# Patient Record
Sex: Female | Born: 1980 | Race: White | Hispanic: No | Marital: Single | Smoking: Current every day smoker
Health system: Southern US, Community
[De-identification: ages and names within clinical notes are randomized; demographics above are authoritative.]

## PROBLEM LIST (undated history)

## (undated) ENCOUNTER — Ambulatory Visit: Admission: EM | Payer: BLUE CROSS/BLUE SHIELD | Source: Home / Self Care

## (undated) DIAGNOSIS — F419 Anxiety disorder, unspecified: Secondary | ICD-10-CM

## (undated) DIAGNOSIS — F329 Major depressive disorder, single episode, unspecified: Secondary | ICD-10-CM

## (undated) DIAGNOSIS — F4310 Post-traumatic stress disorder, unspecified: Secondary | ICD-10-CM

## (undated) DIAGNOSIS — F32A Depression, unspecified: Secondary | ICD-10-CM

---
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
## 2001-08-22 ENCOUNTER — Encounter: Payer: Self-pay | Admitting: Emergency Medicine

## 2001-08-22 ENCOUNTER — Emergency Department (HOSPITAL_COMMUNITY): Admission: EM | Admit: 2001-08-22 | Discharge: 2001-08-22 | Payer: Self-pay | Admitting: Emergency Medicine

## 2001-11-19 ENCOUNTER — Other Ambulatory Visit: Admission: RE | Admit: 2001-11-19 | Discharge: 2001-11-19 | Payer: Self-pay | Admitting: Family Medicine

## 2002-06-03 ENCOUNTER — Encounter: Payer: Self-pay | Admitting: Emergency Medicine

## 2002-06-03 ENCOUNTER — Emergency Department (HOSPITAL_COMMUNITY): Admission: EM | Admit: 2002-06-03 | Discharge: 2002-06-03 | Payer: Self-pay | Admitting: Emergency Medicine

## 2009-03-31 ENCOUNTER — Emergency Department (HOSPITAL_COMMUNITY): Admission: EM | Admit: 2009-03-31 | Discharge: 2009-03-31 | Payer: Self-pay | Admitting: Emergency Medicine

## 2012-08-05 ENCOUNTER — Emergency Department (INDEPENDENT_AMBULATORY_CARE_PROVIDER_SITE_OTHER)
Admission: EM | Admit: 2012-08-05 | Discharge: 2012-08-05 | Disposition: A | Discharge status: Home / Self Care | Payer: Self-pay | Source: Home / Self Care | Attending: Emergency Medicine | Admitting: Emergency Medicine

## 2012-08-05 ENCOUNTER — Encounter (HOSPITAL_COMMUNITY): Payer: Self-pay | Admitting: *Deleted

## 2012-08-05 DIAGNOSIS — R1032 Left lower quadrant pain: Secondary | ICD-10-CM

## 2012-08-05 DIAGNOSIS — N390 Urinary tract infection, site not specified: Secondary | ICD-10-CM

## 2012-08-05 LAB — POCT URINALYSIS DIP (DEVICE)
Bilirubin Urine: NEGATIVE
Glucose, UA: NEGATIVE mg/dL
Ketones, ur: NEGATIVE mg/dL
Nitrite: NEGATIVE

## 2012-08-05 LAB — WET PREP, GENITAL
Clue Cells Wet Prep HPF POC: NONE SEEN
Yeast Wet Prep HPF POC: NONE SEEN

## 2012-08-05 MED ORDER — PHENAZOPYRIDINE HCL 200 MG PO TABS: 200.0000 mg | ORAL_TABLET | Freq: Three times a day (TID) | ORAL | Status: AC | PRN

## 2012-08-05 MED ORDER — CEPHALEXIN 500 MG PO CAPS: 500.0000 mg | ORAL_CAPSULE | Freq: Three times a day (TID) | ORAL | Status: AC

## 2012-08-05 MED ORDER — NAPROXEN 500 MG PO TABS: 500.0000 mg | ORAL_TABLET | Freq: Two times a day (BID) | ORAL | Status: AC
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
## 2012-08-05 NOTE — ED Notes (Signed)
Pt     Reports         Lower  abd  Pain     l  Side    Radiating      To  l  Side          The  Symptoms    Started  Over  The  Weekend                        She  Also  Reports  A  Vaginal  Discharge  As  Well       The  Pt  Ambulated  To  Room  With a  Steady  Fluid  Gait

## 2012-08-06 MED ORDER — METRONIDAZOLE 500 MG PO TABS: ORAL_TABLET | ORAL | Status: DC

## 2012-08-07 LAB — URINE CULTURE: Colony Count: 70000

## 2012-08-12 ENCOUNTER — Telehealth (HOSPITAL_COMMUNITY): Payer: Self-pay | Admitting: *Deleted

## 2012-08-12 NOTE — ED Notes (Signed)
GC/Chlamydia neg., Wet prep: few trich, mod. WBC's. 9/13   Lab shown to Dr. Lorenz Coaster and Flagyl was e-prescribed to [facility].  9/19  I called pt. and left a message to call. Jessica Marshall 08/12/2012

## 2012-08-12 NOTE — ED Notes (Signed)
Pt. called back.  Pt. verified x 2 and given results. Pt. told she needs Flagyl and where to pick it up.   Pt. instructed no alcohol while taking this medication, to notify your partner to be treated with Flagyl, no sex until you have finished your medication and your partner has been treated and to practice safe sex. You can get HIV testing at the [facility] STD clinic by appointment.  Pt. voiced understanding. Vassie Moselle 08/12/2012

## 2014-03-13 ENCOUNTER — Emergency Department (HOSPITAL_COMMUNITY)
Admission: EM | Admit: 2014-03-13 | Discharge: 2014-03-13 | Disposition: A | Discharge status: Home / Self Care | Payer: Self-pay | Attending: Emergency Medicine | Admitting: Emergency Medicine

## 2014-03-13 ENCOUNTER — Encounter (HOSPITAL_COMMUNITY): Payer: Self-pay | Admitting: Emergency Medicine

## 2014-03-13 ENCOUNTER — Emergency Department (HOSPITAL_COMMUNITY): Payer: Self-pay

## 2014-03-13 DIAGNOSIS — Y929 Unspecified place or not applicable: Secondary | ICD-10-CM | POA: Insufficient documentation

## 2014-03-13 DIAGNOSIS — J069 Acute upper respiratory infection, unspecified: Secondary | ICD-10-CM | POA: Insufficient documentation

## 2014-03-13 DIAGNOSIS — F329 Major depressive disorder, single episode, unspecified: Secondary | ICD-10-CM | POA: Insufficient documentation

## 2014-03-13 DIAGNOSIS — S2341XA Sprain of ribs, initial encounter: Secondary | ICD-10-CM | POA: Insufficient documentation

## 2014-03-13 DIAGNOSIS — X58XXXA Exposure to other specified factors, initial encounter: Secondary | ICD-10-CM | POA: Insufficient documentation

## 2014-03-13 DIAGNOSIS — S29011A Strain of muscle and tendon of front wall of thorax, initial encounter: Secondary | ICD-10-CM

## 2014-03-13 DIAGNOSIS — R5381 Other malaise: Secondary | ICD-10-CM | POA: Insufficient documentation

## 2014-03-13 DIAGNOSIS — F17200 Nicotine dependence, unspecified, uncomplicated: Secondary | ICD-10-CM | POA: Insufficient documentation

## 2014-03-13 DIAGNOSIS — F3289 Other specified depressive episodes: Secondary | ICD-10-CM | POA: Insufficient documentation

## 2014-03-13 DIAGNOSIS — B9789 Other viral agents as the cause of diseases classified elsewhere: Secondary | ICD-10-CM

## 2014-03-13 DIAGNOSIS — Z79899 Other long term (current) drug therapy: Secondary | ICD-10-CM | POA: Insufficient documentation

## 2014-03-13 DIAGNOSIS — R5383 Other fatigue: Secondary | ICD-10-CM

## 2014-03-13 DIAGNOSIS — Y939 Activity, unspecified: Secondary | ICD-10-CM | POA: Insufficient documentation

## 2014-03-13 DIAGNOSIS — R42 Dizziness and giddiness: Secondary | ICD-10-CM | POA: Insufficient documentation

## 2014-03-13 HISTORY — DX: Depression, unspecified: F32.A

## 2014-03-13 HISTORY — DX: Major depressive disorder, single episode, unspecified: F32.9

## 2014-03-13 LAB — D-DIMER, QUANTITATIVE (NOT AT ARMC)

## 2014-03-13 MED ORDER — BENZONATATE 100 MG PO CAPS: 100.0000 mg | ORAL_CAPSULE | Freq: Three times a day (TID) | ORAL | Status: DC

## 2014-03-13 MED ORDER — ONDANSETRON 8 MG PO TBDP
8.0000 mg | ORAL_TABLET | Freq: Once | ORAL | Status: AC
  Administered 2014-03-13: 8 mg via ORAL
  Filled 2014-03-13: qty 1

## 2014-03-13 MED ORDER — IBUPROFEN 800 MG PO TABS
800.0000 mg | ORAL_TABLET | Freq: Once | ORAL | Status: AC
  Administered 2014-03-13: 800 mg via ORAL
  Filled 2014-03-13: qty 1

## 2014-03-13 MED ORDER — AEROCHAMBER PLUS W/MASK MISC
1.0000 | Freq: Once | Status: AC
  Administered 2014-03-13: 1
  Filled 2014-03-13 (×2): qty 1

## 2014-03-13 MED ORDER — ALBUTEROL SULFATE (2.5 MG/3ML) 0.083% IN NEBU
5.0000 mg | INHALATION_SOLUTION | Freq: Once | RESPIRATORY_TRACT | Status: AC
  Administered 2014-03-13: 5 mg via RESPIRATORY_TRACT
  Filled 2014-03-13: qty 6

## 2014-03-13 MED ORDER — ALBUTEROL SULFATE HFA 108 (90 BASE) MCG/ACT IN AERS
2.0000 | INHALATION_SPRAY | Freq: Once | RESPIRATORY_TRACT | Status: AC
  Administered 2014-03-13: 2 via RESPIRATORY_TRACT
  Filled 2014-03-13: qty 6.7

## 2014-03-13 NOTE — ED Notes (Signed)
Per pt, c/o shortness of breath, dizziness, cough since last week.  Also having sore throat.  No fever noted.

## 2014-03-13 NOTE — Discharge Instructions (Signed)
1. Medications: albuterol, tessalon, ibuprofen 800mg  three times per day with food, mucinex, usual home medications 2. Treatment: rest, drink plenty of fluids,  3. Follow Up: [facility] for discussion of your diagnoses and further evaluation after today's visit; if you do not have a primary care doctor use the resource guide provided to find one;    Upper Respiratory Infection, Adult An upper respiratory infection (URI) is also known as the common cold. It is often caused by a type of germ (virus). Colds are easily spread (contagious). You can pass it to others by kissing, coughing, sneezing, or drinking out of the same glass. Usually, you get better in 1 or 2 weeks.  HOME CARE   Only take medicine as told by your doctor.  Use a warm mist humidifier or breathe in steam from a hot shower.  Drink enough water and fluids to keep your pee (urine) clear or pale yellow.  Get plenty of rest.  Return to work when your temperature is back to normal or as told by your doctor. You may use a face mask and wash your hands to stop your cold from spreading. GET HELP RIGHT AWAY IF:   After the first few days, you feel you are getting worse.  You have questions about your medicine.  You have chills, shortness of breath, or brown or red spit (mucus).  You have yellow or brown snot (nasal discharge) or pain in the face, especially when you bend forward.  You have a fever, puffy (swollen) neck, pain when you swallow, or white spots in the back of your throat.  You have a bad headache, ear pain, sinus pain, or chest pain.  You have a high-pitched whistling sound when you breathe in and out (wheezing).  You have a lasting cough or cough up blood.  You have sore muscles or a stiff neck. MAKE SURE YOU:   Understand these instructions.  Will watch your condition.  Will get help right away if you are not doing well or get worse. Document Released:  04/28/2008 Document Revised: 02/02/2012 Document Reviewed: 03/17/2011 [facility] Patient Information 2014 [place], [place].    Emergency Department Resource Guide 1) Find a Doctor and Pay Out of Pocket Although you won't have to find out who is covered by your insurance plan, it is a good idea to ask around and get recommendations. You will then need to call the office and see if the doctor you have chosen will accept you as a new patient and what types of options they offer for patients who are self-pay. Some doctors offer discounts or will set up payment plans for their patients who do not have insurance, but you will need to ask so you aren't surprised when you get to your appointment.  2) Contact Your Local Health Department Not all health departments have doctors that can see patients for sick visits, but many do, so it is worth a call to see if yours does. If you don't know where your local health department is, you can check in your phone book. The CDC also has a tool to help you locate your state's health department, and many state websites also have listings of all of their local health departments.  3) Find a Walk-in Clinic If your illness is not likely to be very severe or complicated, you may want to try a walk in clinic. These are popping up all over the country in pharmacies, drugstores,  and shopping centers. They're usually staffed by nurse practitioners or physician assistants that have been trained to treat common illnesses and complaints. They're usually fairly quick and inexpensive. However, if you have serious medical issues or chronic medical problems, these are probably not your best option.  No Primary Care Doctor: - Call Health Connect at  [phone] - they can help you locate a primary care doctor that  accepts your insurance, provides certain services, etc. - Physician Referral Service- [phone]  Chronic Pain Problems: Organization         Address  Phone    Notes  [facility]  [phone] Patients need to be referred by their primary care doctor.   Medication Assistance: Organization         Address  Phone   Notes  [facility] Medication [facility] [address] [place], [place] [address] [phone] --Must be a resident of [place] [facility] -- Must have NO insurance coverage whatsoever (no Medicaid/ Medicare, etc.) -- The pt. MUST have a primary care doctor that directs their care regularly and follows them in the community   MedAssist  [phone]   [employer]  [phone]    Agencies that provide inexpensive medical care: Organization         Address  Phone   Notes  [facility]  [phone]   [facility]    [phone]) [phone]   [facility] [address] [place], [place] [address] [phone]   [facility] [address], [place] [phone]   [facility]anned Parenthood    [phone]) [phone]   [facility]    [phone]   [facility]  [address], [place] Phone:  [phone](336) [phone], Fax:  [phone](336) [phone] Hours of Operation:  9 am - 6 pm, M-F.  Also accepts Medicaid/Medicare and self-pay.  [facility]  [address], [place] Phone: [phone]) [phone], Fax: [phone]. Hours of Operation:  8:30 am - 5:30 pm, M-F.  Also accepts Medicaid and self-pay.  [facility] [place] [address], [place] Phone: [phone]   [facility] [address], [place], [place] [phone], Ext. [phone] Mondays & Thursdays: 7-9 AM.  First 15 patients are seen on a first come, first serve basis.    Medicaid-accepting [facility] Providers:  Organization         Address  Phone   Notes  [facility] [address], [place] [phone]) [phone] Also accepts self-pay patients.  [facility]  [address] [place], [place]  [phone]   [facility] [address], [place] [phone]   [facility] [address] [place] [address], [place] [phone]) [phone]   Renaye RakersVeita Bland [address], [place]   [phone]) [phone] Only accepts [place] Access [place] patients after they have their name applied to their card.   Self-Pay (no insurance) in [facility] [place]:  Organization         Address  Phone   Notes  Sickle Cell Patients, [facility] [address], [place] [phone]) [phone]   [facility] [address], [place] [phone]) [phone]   [facility] [place]  [address], [place] [phone])  [phone]   [facility]/Dr. Julio Sickssei-Bonsu  [address] [place] [address], [place] or [address], [place] [phone]) [phone] Phone number for both [place] and [place] locations is the same.  [facility] [address], [place] [phone]) [phone]   [facility] [address] [place] [address], [place] or [address] [phone]   [facility] [address], [place] [phone]) [phone], phone; [phone](336) [phone], fax Sees patients 1st and 3rd Saturday of every month.  Must not qualify for public or private insurance (i.e. Medicaid, Medicare, [place] Health Choice, Veterans' Benefits)  Household income should be no more than 200% of the poverty level The clinic cannot treat you if you are pregnant or think you are pregnant  Sexually transmitted diseases are not treated at the clinic.    Dental Care: Organization         Address  Phone  Notes  [facility] [address], [place] [phone] Accepts children up to age 32 who are enrolled in [place]icaid or [place] Health Choice; pregnant women with a Medicaid card; and children who have  applied for Medicaid or [place] Health Choice, but were declined, whose parents can pay a reduced fee at time of service.  [facility] [place]  [address], [place] [phone]) [phone] Accepts children up to age 32 who are enrolled in [place]icaid or [place] Health Choice; pregnant women with a Medicaid card; and children who have applied for Medicaid or [place] Health Choice, but were declined, whose parents can pay a reduced fee at time of service.  [facility]  [address], [place] [phone] Patients are seen by appointment only. Walk-ins are not accepted. [facility] will see patients 32 years of age and older. Monday - Tuesday (8am-5pm) Most Wednesdays (8:30-5pm) $30 per visit, cash only  [facility]  [address], [facility] [place] [phone] Patients are seen by appointment only. Walk-ins are not accepted. [facility] will see patients 32 years of age and older. One Wednesday Evening (Monthly: Volunteer Based).  $30 per visit, cash only  [facility]  [phone] for adults; Children under age 534, call Graduate Pediatric Dentistry at [phone]. Children aged 284-14, please call [phone] to request a pediatric application.  Dental services are provided in all areas of dental care including fillings, crowns and bridges, complete and partial dentures, implants, gum treatment, root canals, and extractions. Preventive care is also provided. Treatment is provided to both adults and children. Patients are selected via a lottery and there is often a waiting list.   [facility] [address], [place]  [phone] [email or web address]   [facility] [address], [place], [place] [phone], Ext. [phone] Second and Fourth Thursday of each month, opens at 6:30 AM; Clinic ends at 9 AM.  Patients are seen on a first-come first-served basis, and a  limited number are seen during each clinic.   [facility]  [address], [place], [place] [phone]) [phone]   Eligibility Requirements You must have lived in [place], [place], or [place] counties for at least the last three months.   You cannot be eligible for state or federal sponsored National Cityhealthcare insurance, including CIGNAVeterans Administration, [place]icaid, or [employer]Medicare.   You generally cannot be eligible  through your employer.  °  How to apply: °Eligibility screenings are held every Tuesday and Wednesday afternoon from 1:00 pm until 4:00 pm. You do not need an appointment for the interview!  °[facility] [address], [place], [place] [phone]   °[facility]  336-342-8273   °[place] [facility]  336-703-3100   °[place]  336-570-6415   ° °Behavioral Health Resources in the Community: °Intensive Outpatient Programs °Organization         Address  Phone  Notes  °[place] [facility] [address], [place], [place] [phone]   °[facility] [address], [place], [place] 336-832-9800   °[facility] [address], [place], [place] ° 336-882-2125   °[facility] [address],  °[place], [place] 1-800-853-5163 or 336-641-4981   °Substance Abuse Resources °Organization         Address  Phone  Notes  °[facility]  336-882-2125   °[facility]  336-784-9470   °[facility]  336-285-9073   °[facility]  336-845-3988   °Residential & Outpatient Substance Abuse Program  1-800-659-3381   °Psychological Services °Organization         Address  Phone  Notes  °[facility]  336- 832-9600   °[facility]  336- 378-7881   °[facility] [address], [place] 1-800-853-5163 or 336-641-4981   ° °Mobile Crisis Teams °Organization          Address  Phone  Notes  °[facility], Mobile Crisis Care Unit  1-877-626-1772   °Assertive °[facility]otherapeutic Services ° [address] [place], [place] 336-834-9664   °Sharon DeEsch [address] °[place] 336-554-5454   ° °Self-Help/Support Groups °Organization         Address  Phone             Notes  °[facility] [place] - variety of support groups  336- 373-1402 Call for more information  °Narcotics Anonymous (NA), [facility] [address], °[place]  2 meetings at this location  ° °Residential Treatment Programs °Organization         Address  Phone  Notes  °[facility] [address],    °[place]  1-866-801-8205   °[facility] ° [address], [place], [place] 704-293-8524   °[facility] [address], [place] 336-845-3988 Admissions: 8am-3pm M-F  °[facility] [address]    °[place], [place] 336-841-1104   °[facility] [address], [place], [place] 336-379-7146   °[facility] [address]  °[place], [place] 336-285-9073   °[facility] - Intensive Outpatient [address], , [place] 336-852-3033   °[facility] ([facility] [address]  °[place], [place] 1-877-615-2722 or 336-784-9470   °[facility] ([facility]) [address] [place], [place] 336-227-7417 Accepts Medicaid  °[facility] [address]  ° [place] 1-800-659-3381 Substance Abuse/Addiction Treatment  ° °[place] Behavioral Health Resources °Organization         Address  Phone  Notes  °[facility]  (888) 581-9988   °Julie Brannon, PhD [address] [place], [place]   (336) 349-5553 or (336) 951-0000   °[facility]   [address] °[place], [place] (336) 349-4454   °  (570) 718-0069   [facility] [address] [place], [place] [phone] Insurance/Medicaid/sponsorship  through [employer] and Families [address] [place] [address]                                    [place], [place] [phone] Therapy/tele-psych/case  [facility] [address]   [place], [place] [phone]    Dr. Lolly Mustache  [phone]   [facility] [place]  [employer] [facility] Dept. 1) [address] [place] 2) [address] [place] 3)  [address], [place] [phone]  [phone]   [facility] Child Abuse Hotline 581-672-9628 or (217) 114-0851 (After Hours)

## 2014-03-13 NOTE — ED Provider Notes (Signed)
CSN: [id number]     Arrival date & time 03/13/14  1513 History   First MD Initiated Contact with Patient 03/13/14 1625     Chief Complaint  Patient presents with  . Shortness of Breath  . Dizziness     (Consider location/radiation/quality/duration/timing/severity/associated sxs/prior Treatment) The history is provided by the patient and medical records. No language interpreter was used.    Jessica Marshall is a 33 y.o. female with a hx of depression presents to the Emergency Department complaining of gradual, waxing and waning, progressively worsening chest pain with inspiration, mild SOB and cough onset approx 1 week ago with worsening today.  Pt reports nasal congestion, post nasal drip, sore throat, cough and lightheadedness.  Pt reports attempting to smoke today and it made all of her symptoms worse.  Pt denies recent surgery, swelling in her legs, travel, immobilization and exogenous estrogen.   Pt has not attempted any OTC treatments.  Pt reports she just finished seeing mental health this AM when she tried to smoke and everything got worse. Nothing makes the symptoms better.  Pt denies personal cardiac hx but reports family hx of cardiac disease and blood clots.  No sudden death in the family.  Pt denies fever, chills, headache, neck pain, abd pain, N/V/D, weakness, dizziness, syncope, dysuria, hematuria.  Pt is an everyday smoker.    Past Medical History  Diagnosis Date  . Depression    History reviewed. No pertinent past surgical history. History reviewed. No pertinent family history. History  Substance Use Topics  . Smoking status: Current Every Day Smoker  . Smokeless tobacco: Not on file  . Alcohol Use: No   OB History   Grav Para Term Preterm Abortions TAB SAB Ect Mult Living                 Review of Systems  Constitutional: Positive for fatigue. Negative for fever, diaphoresis, appetite change and unexpected weight change.  HENT: Positive for congestion, postnasal  drip, rhinorrhea, sinus pressure and sore throat. Negative for mouth sores.   Eyes: Negative for visual disturbance.  Respiratory: Positive for cough and shortness of breath. Negative for chest tightness and wheezing.   Cardiovascular: Positive for chest pain.  Gastrointestinal: Negative for nausea, vomiting, abdominal pain, diarrhea and constipation.  Endocrine: Negative for polydipsia, polyphagia and polyuria.  Genitourinary: Negative for dysuria, urgency, frequency and hematuria.  Musculoskeletal: Negative for back pain and neck stiffness.  Skin: Negative for rash.  Allergic/Immunologic: Negative for immunocompromised state.  Neurological: Positive for light-headedness. Negative for syncope and headaches.  Hematological: Does not bruise/bleed easily.  Psychiatric/Behavioral: Negative for sleep disturbance. The patient is not nervous/anxious.       Allergies  Review of patient's allergies indicates no known allergies.  Home Medications   Prior to Admission medications   Medication Sig Start Date End Date Taking? Authorizing Provider  sertraline (ZOLOFT) 100 MG tablet Take 200 mg by mouth daily.    Yes Historical Provider, MD  traZODone (DESYREL) 100 MG tablet Take 100 mg by mouth at bedtime.   Yes Historical Provider, MD   BP 125/76  Pulse 60  Temp(Src) 98.2 F (36.8 C) (Oral)  Resp 20  Ht 5\' 8"  (1.727 m)  Wt 180 lb (81.647 kg)  BMI 27.38 kg/m2  SpO2 100%  LMP 02/23/2014 Physical Exam  Nursing note and vitals reviewed. Constitutional: She is oriented to person, place, and time. She appears well-developed and well-nourished. No distress.  Awake, alert, nontoxic appearance  HENT:  Head: Normocephalic and atraumatic.  Right Ear: Tympanic membrane, external ear and ear canal normal.  Left Ear: Tympanic membrane, external ear and ear canal normal.  Nose: Mucosal edema and rhinorrhea present. No epistaxis. Right sinus exhibits no maxillary sinus tenderness and no frontal  sinus tenderness. Left sinus exhibits no maxillary sinus tenderness and no frontal sinus tenderness.  Mouth/Throat: Uvula is midline, oropharynx is clear and moist and mucous membranes are normal. Mucous membranes are not pale and not cyanotic. No oropharyngeal exudate, posterior oropharyngeal edema, posterior oropharyngeal erythema or tonsillar abscesses.  Eyes: Conjunctivae are normal. Pupils are equal, round, and reactive to light. No scleral icterus.  Neck: Normal range of motion and full passive range of motion without pain. Neck supple.  Cardiovascular: Normal rate, regular rhythm, normal heart sounds and intact distal pulses.   No murmur heard. RRR - no tachycardia  Pulmonary/Chest: Effort normal. No accessory muscle usage or stridor. Not tachypneic. No respiratory distress. She has decreased breath sounds. She has no wheezes. She has no rhonchi. She has no rales. She exhibits tenderness.    Coarse, but otherwise clear and equal breath sounds; mildly diminished Pain to palpation of the anterior chest, right chest and right ribs  Abdominal: Soft. Bowel sounds are normal. She exhibits no mass. There is no tenderness. There is no rebound and no guarding.  abd soft and nontender  Musculoskeletal: Normal range of motion. She exhibits no edema.  ROM of right shoulder makes the right chest hurt worse Full ROM of right shoudler NO peripheral edema, no calf swelling no ttp of the bilateral calf, no palpable cord, neg Homan's sign  Lymphadenopathy:    She has no cervical adenopathy.  Neurological: She is alert and oriented to person, place, and time. She exhibits normal muscle tone. Coordination normal.  Speech is clear and goal oriented Moves extremities without ataxia  Skin: Skin is warm and dry. No rash noted. She is not diaphoretic. No erythema.  Psychiatric: She has a normal mood and affect.    ED Course  Procedures (including critical care time) Labs Review Labs Reviewed  D-DIMER,  QUANTITATIVE    Imaging Review Dg Chest 2 View  03/13/2014   CLINICAL DATA:  Shortness of breath with dizziness.  Smoker.  EXAM: CHEST  2 VIEW  COMPARISON:  DG CHEST 2V dated 11/06/2012  FINDINGS: The heart size and mediastinal contours are normal. The lungs are clear. There is no pleural effusion or pneumothorax. No acute osseous findings are identified.  IMPRESSION: Stable examination.  No acute cardiopulmonary process.   Electronically Signed   By: Roxy HorsemanBill  Veazey M.D.   On: 03/13/2014 16:45     EKG Interpretation None      ECG:  Date: 03/13/2014  Rate: 56  Rhythm: sinus bradycardia  QRS Axis: normal  Intervals: normal  ST/T Wave abnormalities: normal  Conduction Disutrbances:none  Narrative Interpretation: nonischemic ECG, no old for comparison  Old EKG Reviewed: none available    MDM   Final diagnoses:  Viral URI with cough  Chest wall muscle strain   Jessica Marshall presents with URI symptoms including rhinorrhea, sore throat, cough, postnasal drip and lightheadedness. Patient has associated right-sided chest pain worse with inspiration, movement and palpation.  Pain is reproducible on exam with palpation to the right side of the chest and right ribs. Patient is alert, oriented, nontoxic nonseptic appearing.  Clinical breath sounds without increased effort. No tachycardia.  Family and patient report SIGNIFICANT concern about cardiac or clot etiology  of pt's pain.  Will give albuterol treatment, obtain ECG and d-dimer.   Patient is PERC negative; no clinical evidence of DVT.  7:06 PM Patient reports she is breathing much better after her albuterol treatment. Breath sounds remain clear and equal with slightly increased tidal volume. D-dimer negative. Chest x-ray without evidence of pneumonia, pulmonary edema or pneumothorax.  ECG nonischemic.  I personally reviewed the imaging tests through PACS system  I reviewed available ER/hospitalization records through the  EMR  Patient's chest pain continues to be reproducible and she reports alleviation of her shortness of breath and dizziness after her albuterol treatment.  Patient without hypoxia throughout her time here in the emergency department.  Highly doubt PE. Highly doubt cardiac etiology of patient's symptoms.  She ambulates without hypoxia or shortness of breath.  Chest pain likely secondary to chest wall strain from coughing.  Pt CXR negative for acute infiltrate. Patients symptoms are consistent with URI, likely viral etiology. Discussed that antibiotics are not indicated for viral infections. Pt will be discharged with symptomatic treatment including albuterol inhaler and cough suppression.  Verbalizes understanding and is agreeable with plan. Pt is hemodynamically stable & in NAD prior to dc.  It has been determined that no acute conditions requiring further emergency intervention are present at this time. The patient/guardian have been advised of the diagnosis and plan. We have discussed signs and symptoms that warrant return to the ED, such as changes or worsening in symptoms.   Vital signs are stable at discharge.   BP 125/76  Pulse 60  Temp(Src) 98.2 F (36.8 C) (Oral)  Resp 20  Ht 5\' 8"  (1.727 m)  Wt 180 lb (81.647 kg)  BMI 27.38 kg/m2  SpO2 100%  LMP 02/23/2014  Patient/guardian has voiced understanding and agreed to follow-up with the PCP or specialist.          Dierdre Forth, PA-C 03/13/14 1918

## 2014-03-14 NOTE — ED Provider Notes (Signed)
Medical screening examination/treatment/procedure(s) were performed by non-physician practitioner and as supervising physician I was immediately available for consultation/collaboration.   EKG Interpretation None        Jessica ChurnJohn David Saliha Salts III, MD 03/14/14 [phone]

## 2015-06-20 ENCOUNTER — Encounter (HOSPITAL_COMMUNITY): Payer: Self-pay | Admitting: Emergency Medicine

## 2015-06-20 ENCOUNTER — Emergency Department (INDEPENDENT_AMBULATORY_CARE_PROVIDER_SITE_OTHER)
Admission: EM | Admit: 2015-06-20 | Discharge: 2015-06-20 | Disposition: A | Discharge status: Home / Self Care | Payer: Self-pay | Source: Home / Self Care | Attending: Family Medicine | Admitting: Family Medicine

## 2015-06-20 DIAGNOSIS — K0889 Other specified disorders of teeth and supporting structures: Secondary | ICD-10-CM

## 2015-06-20 DIAGNOSIS — K088 Other specified disorders of teeth and supporting structures: Secondary | ICD-10-CM

## 2015-06-20 MED ORDER — DICLOFENAC SODIUM 75 MG PO TBEC: 75.0000 mg | DELAYED_RELEASE_TABLET | Freq: Two times a day (BID) | ORAL | Status: DC

## 2015-06-20 MED ORDER — AMOXICILLIN 500 MG PO CAPS: 500.0000 mg | ORAL_CAPSULE | Freq: Three times a day (TID) | ORAL | Status: DC

## 2015-06-20 NOTE — Discharge Instructions (Signed)

## 2015-06-20 NOTE — ED Notes (Signed)
Requesting work note

## 2015-06-20 NOTE — ED Provider Notes (Signed)
CSN: [id number]     Arrival date & time 06/20/15  1304 History   First MD Initiated Contact with Patient 06/20/15 1337     No chief complaint on file.  (Consider location/radiation/quality/duration/timing/severity/associated sxs/prior Treatment) Patient is a 34 y.o. female presenting with tooth pain. The history is provided by the patient. No language interpreter was used.  Dental Pain Location:  Lower Quality:  Aching Severity:  Moderate Onset quality:  Gradual Duration:  2 days Timing:  Constant Progression:  Worsening Chronicity:  New Context: not dental fracture   Previous work-up:  Dental exam Relieved by:  Nothing Worsened by:  Nothing tried Associated symptoms: gum swelling   Associated symptoms: no congestion and no fever   Risk factors: no diabetes     Past Medical History  Diagnosis Date  . Depression    No past surgical history on file. No family history on file. History  Substance Use Topics  . Smoking status: Current Every Day Smoker  . Smokeless tobacco: Not on file  . Alcohol Use: No   OB History    No data available     Review of Systems  Constitutional: Negative for fever.  HENT: Negative for congestion.   All other systems reviewed and are negative.   Allergies  Review of patient's allergies indicates no known allergies.  Home Medications   Prior to Admission medications   Medication Sig Start Date End Date Taking? Authorizing Provider  benzonatate (TESSALON) 100 MG capsule Take 1 capsule (100 mg total) by mouth every 8 (eight) hours. 03/13/14   Hannah Muthersbaugh, PA-C  sertraline (ZOLOFT) 100 MG tablet Take 200 mg by mouth daily.     Historical Provider, MD  traZODone (DESYREL) 100 MG tablet Take 100 mg by mouth at bedtime.    Historical Provider, MD   There were no vitals taken for this visit. Physical Exam  Constitutional: She is oriented to person, place, and time. She appears well-developed and well-nourished.  HENT:  Head:  Normocephalic.  No obv abscess  Eyes: EOM are normal.  Neck: Normal range of motion.  Pulmonary/Chest: Effort normal.  Abdominal: She exhibits no distension.  Musculoskeletal: Normal range of motion.  Neurological: She is alert and oriented to person, place, and time.  Psychiatric: She has a normal mood and affect.  Nursing note and vitals reviewed.   ED Course  Procedures (including critical care time) Labs Review Labs Reviewed - No data to display  Imaging Review No results found.   MDM   1. Toothache    Pt has an appointment with dentist on Monday voltaren amoxicillian AVS    Elson Areas, PA-C 06/20/15 1432

## 2015-06-20 NOTE — ED Notes (Signed)
Patient reports both, bottom, back teeth hurting.  Reports having a dentist appt scheduled for next week

## 2015-10-04 ENCOUNTER — Encounter (HOSPITAL_COMMUNITY): Payer: Self-pay | Admitting: Emergency Medicine

## 2015-10-04 ENCOUNTER — Emergency Department (HOSPITAL_COMMUNITY)
Admission: EM | Admit: 2015-10-04 | Discharge: 2015-10-04 | Disposition: A | Discharge status: Home / Self Care | Payer: Self-pay | Attending: Emergency Medicine | Admitting: Emergency Medicine

## 2015-10-04 DIAGNOSIS — Z720 Tobacco use: Secondary | ICD-10-CM | POA: Insufficient documentation

## 2015-10-04 DIAGNOSIS — Z5189 Encounter for other specified aftercare: Secondary | ICD-10-CM

## 2015-10-04 DIAGNOSIS — F329 Major depressive disorder, single episode, unspecified: Secondary | ICD-10-CM | POA: Insufficient documentation

## 2015-10-04 DIAGNOSIS — Z4801 Encounter for change or removal of surgical wound dressing: Secondary | ICD-10-CM | POA: Insufficient documentation

## 2015-10-04 DIAGNOSIS — M7981 Nontraumatic hematoma of soft tissue: Secondary | ICD-10-CM | POA: Insufficient documentation

## 2015-10-04 DIAGNOSIS — Z792 Long term (current) use of antibiotics: Secondary | ICD-10-CM | POA: Insufficient documentation

## 2015-10-04 DIAGNOSIS — Z791 Long term (current) use of non-steroidal anti-inflammatories (NSAID): Secondary | ICD-10-CM | POA: Insufficient documentation

## 2015-10-04 DIAGNOSIS — Z79899 Other long term (current) drug therapy: Secondary | ICD-10-CM | POA: Insufficient documentation

## 2015-10-04 MED ORDER — NAPROXEN 500 MG PO TABS: 500.0000 mg | ORAL_TABLET | Freq: Two times a day (BID) | ORAL | Status: DC

## 2015-10-04 NOTE — Discharge Instructions (Signed)
Abscess looks okay right now, will likely be sore for the next day or so which is normal. Continue to perform warm compresses and dressing changes at home. Take Naprosyn as directed. You may supplement this with Tylenol if needed. Follow-up with urgent care for recheck or return here for any new or worsening symptoms.

## 2015-10-04 NOTE — ED Notes (Signed)
Patient states went to urgent care yesterday and had abscess I & D'd, but states "it's filling back up and hurts a lot".  Patient states took advil at home and it didn't help at all.

## 2015-10-04 NOTE — ED Provider Notes (Signed)
CSN: [id number]     Arrival date & time 10/04/15  4098 History   First MD Initiated Contact with Patient 10/04/15 [phone]     Chief Complaint  Patient presents with  . Abscess     (Consider location/radiation/quality/duration/timing/severity/associated sxs/prior Treatment) Patient is a 34 y.o. female presenting with abscess. The history is provided by the patient and medical records.  Abscess    34 year old female with history of depression, presenting to the ED for left thigh abscess recheck. Patient states she had abscess I&D'd yesterday at urgent care, however states pain is worse today.  She states abscess has continued to drain. She denies any fever or chills. She states her leg feels more "swollen" today. Pain is worse with sitting on her right side or when she walks in her thighs rub together.  Patient states she was started on antibiotics and was given Vicodin, however she is very taken all the Vicodin. She took Aleve this morning without relief.  VSS.  Past Medical History  Diagnosis Date  . Depression    History reviewed. No pertinent past surgical history. No family history on file. Social History  Substance Use Topics  . Smoking status: Current Every Day Smoker -- 0.50 packs/day    Types: Cigarettes  . Smokeless tobacco: None  . Alcohol Use: No   OB History    No data available     Review of Systems  Skin: Positive for wound.  All other systems reviewed and are negative.     Allergies  Review of patient's allergies indicates no known allergies.  Home Medications   Prior to Admission medications   Medication Sig Start Date End Date Taking? Authorizing Provider  amoxicillin (AMOXIL) 500 MG capsule Take 1 capsule (500 mg total) by mouth 3 (three) times daily. 06/20/15   Elson Areas, PA-C  benzonatate (TESSALON) 100 MG capsule Take 1 capsule (100 mg total) by mouth every 8 (eight) hours. 03/13/14   Hannah Muthersbaugh, PA-C  diclofenac (VOLTAREN) 75 MG EC  tablet Take 1 tablet (75 mg total) by mouth 2 (two) times daily. 06/20/15   Elson Areas, PA-C  sertraline (ZOLOFT) 100 MG tablet Take 200 mg by mouth daily.     Historical Provider, MD  traZODone (DESYREL) 100 MG tablet Take 100 mg by mouth at bedtime.    Historical Provider, MD   BP 130/74 mmHg  Pulse 78  Temp(Src) 97.9 F (36.6 C) (Oral)  Resp 16  Wt 183 lb 3 oz (83.093 kg)  SpO2 100%  LMP 09/27/2015   Physical Exam  Constitutional: She is oriented to person, place, and time. She appears well-developed and well-nourished.  HENT:  Head: Normocephalic and atraumatic.  Mouth/Throat: Oropharynx is clear and moist.  Eyes: Conjunctivae and EOM are normal. Pupils are equal, round, and reactive to light.  Neck: Normal range of motion.  Cardiovascular: Normal rate, regular rhythm and normal heart sounds.   Pulmonary/Chest: Effort normal and breath sounds normal.  Abdominal: Soft. Bowel sounds are normal.  Musculoskeletal: Normal range of motion.  Neurological: She is alert and oriented to person, place, and time.  Skin: Skin is warm and dry.  Abscess noted to left medial thigh, open and draining purulent fluid; no central fluctuance; small amount of bruising noted; slight erythema surrounding edges of abscess without overt induration or cellulitis  Psychiatric: She has a normal mood and affect.  Nursing note and vitals reviewed.   ED Course  Procedures (including critical care time) Labs Review Labs  Reviewed - No data to display  Imaging Review No results found. I have personally reviewed and evaluated these images and lab results as part of my medical decision-making.   EKG Interpretation None      MDM   Final diagnoses:  Wound check, abscess   34 year old female here for recheck of left thigh abscess that was incised yesterday urgent care. Patient afebrile, nontoxic. Abscess is open and draining purulent fluid. There is no central fluctuance, surrounding erythema, or  signs of induration. There is no lymphangitis. There is a small amount of bruising noted which I suspect is from I&D. At this time, do not feel further intervention required. Patient will continue her antibiotics. She is to follow-up with urgent care for further rechecks.  Rx naprosyn.  Discussed plan with patient, he/she acknowledged understanding and agreed with plan of care.  Return precautions given for new or worsening symptoms.  Garlon HatchetLisa M Rossana Molchan, PA-C 10/04/15 [phone]  Marily MemosJason Mesner, MD 10/04/15 [phone]

## 2017-03-26 DIAGNOSIS — R197 Diarrhea, unspecified: Secondary | ICD-10-CM | POA: Diagnosis not present

## 2017-03-26 DIAGNOSIS — R1030 Lower abdominal pain, unspecified: Secondary | ICD-10-CM | POA: Diagnosis not present

## 2017-05-19 DIAGNOSIS — Z5181 Encounter for therapeutic drug level monitoring: Secondary | ICD-10-CM | POA: Diagnosis not present

## 2017-06-02 ENCOUNTER — Emergency Department (HOSPITAL_COMMUNITY)
Admission: EM | Admit: 2017-06-02 | Discharge: 2017-06-02 | Disposition: A | Discharge status: Home / Self Care | Payer: 59 | Attending: Emergency Medicine | Admitting: Emergency Medicine

## 2017-06-02 ENCOUNTER — Ambulatory Visit (HOSPITAL_COMMUNITY)
Admission: EM | Admit: 2017-06-02 | Discharge: 2017-06-02 | Disposition: A | Discharge status: Nursing Home (Includes ICF) | Payer: 59 | Source: Home / Self Care

## 2017-06-02 ENCOUNTER — Emergency Department (HOSPITAL_COMMUNITY): Payer: 59

## 2017-06-02 ENCOUNTER — Encounter (HOSPITAL_COMMUNITY): Payer: Self-pay | Admitting: Vascular Surgery

## 2017-06-02 DIAGNOSIS — Z79899 Other long term (current) drug therapy: Secondary | ICD-10-CM | POA: Insufficient documentation

## 2017-06-02 DIAGNOSIS — R0789 Other chest pain: Secondary | ICD-10-CM | POA: Diagnosis not present

## 2017-06-02 DIAGNOSIS — R079 Chest pain, unspecified: Secondary | ICD-10-CM | POA: Diagnosis not present

## 2017-06-02 DIAGNOSIS — F17210 Nicotine dependence, cigarettes, uncomplicated: Secondary | ICD-10-CM | POA: Insufficient documentation

## 2017-06-02 LAB — I-STAT TROPONIN, ED: TROPONIN I, POC: 0 ng/mL (ref 0.00–0.08)

## 2017-06-02 LAB — BASIC METABOLIC PANEL
ANION GAP: 7 (ref 5–15)
BUN: 9 mg/dL (ref 6–20)
CALCIUM: 9.5 mg/dL (ref 8.9–10.3)
CO2: 23 mmol/L (ref 22–32)
Chloride: 106 mmol/L (ref 101–111)
Creatinine, Ser: 1.02 mg/dL — ABNORMAL HIGH (ref 0.44–1.00)
Glucose, Bld: 87 mg/dL (ref 65–99)
Potassium: 4.5 mmol/L (ref 3.5–5.1)
Sodium: 136 mmol/L (ref 135–145)

## 2017-06-02 LAB — CBC
HCT: 39.1 % (ref 36.0–46.0)
Hemoglobin: 13.3 g/dL (ref 12.0–15.0)
MCH: 32.3 pg (ref 26.0–34.0)
MCHC: 34 g/dL (ref 30.0–36.0)
MCV: 94.9 fL (ref 78.0–100.0)
Platelets: 225 10*3/uL (ref 150–400)
RBC: 4.12 MIL/uL (ref 3.87–5.11)
RDW: 12 % (ref 11.5–15.5)
WBC: 6.4 10*3/uL (ref 4.0–10.5)

## 2017-06-02 MED ORDER — METHOCARBAMOL 750 MG PO TABS: 750.0000 mg | ORAL_TABLET | Freq: Four times a day (QID) | ORAL | 0 refills | Status: DC

## 2017-06-02 MED ORDER — KETOROLAC TROMETHAMINE 60 MG/2ML IM SOLN
60.0000 mg | Freq: Once | INTRAMUSCULAR | Status: AC
  Administered 2017-06-02: 60 mg via INTRAMUSCULAR
  Filled 2017-06-02: qty 2

## 2017-06-02 MED ORDER — METHOCARBAMOL 500 MG PO TABS
500.0000 mg | ORAL_TABLET | Freq: Once | ORAL | Status: AC
  Administered 2017-06-02: 500 mg via ORAL
  Filled 2017-06-02: qty 1

## 2017-06-02 MED ORDER — IBUPROFEN 600 MG PO TABS: 600.0000 mg | ORAL_TABLET | Freq: Four times a day (QID) | ORAL | 0 refills | Status: DC | PRN

## 2017-06-02 NOTE — ED Triage Notes (Signed)
Pt reports to the ED for eval of left sided, sharp CPs since last Wednesday. Pain is worse with movement and deep breaths. She reports some associated SOB, diaphoresis, nausea and back pain. She has not tried any medications for the pain.

## 2017-06-02 NOTE — ED Provider Notes (Signed)
[facility]-EMERGENCY DEPT Provider Note   CSN: [id number] Arrival date & time: 06/02/17  1121     History   Chief Complaint Chief Complaint  Patient presents with  . Chest Pain    HPI Jessica Marshall is a 36 y.o. female.  36 year old female presents with left-sided sharp chest pain that radiates to her left flank 1 week. Pain is worse with movement and better with remaining still. She denies any rashes or sensation of burning to the skin. No urinary symptoms. Some shortness of breath but denies any exertional dyspnea. No leg pain or swelling. Pain better with remaining still. She also notes increased anxiety due to the recent death of her friend. Has had insomnia but denies any suicidal or homicidal ideations. Denies any syncope or near-syncope. No palpitations. No prior history of same.      Past Medical History:  Diagnosis Date  . Depression     There are no active problems to display for this patient.   History reviewed. No pertinent surgical history.  OB History    No data available       Home Medications    Prior to Admission medications   Medication Sig Start Date End Date Taking? Authorizing Provider  amoxicillin (AMOXIL) 500 MG capsule Take 1 capsule (500 mg total) by mouth 3 (three) times daily. 06/20/15   Elson AreasSofia, Leslie K, PA-C  benzonatate (TESSALON) 100 MG capsule Take 1 capsule (100 mg total) by mouth every 8 (eight) hours. 03/13/14   Muthersbaugh, Dahlia ClientHannah, PA-C  diclofenac (VOLTAREN) 75 MG EC tablet Take 1 tablet (75 mg total) by mouth 2 (two) times daily. 06/20/15   Elson AreasSofia, Leslie K, PA-C  naproxen (NAPROSYN) 500 MG tablet Take 1 tablet (500 mg total) by mouth 2 (two) times daily with a meal. 10/04/15   Garlon HatchetSanders, Lisa M, PA-C  sertraline (ZOLOFT) 100 MG tablet Take 200 mg by mouth daily.     [provider]  traZODone (DESYREL) 100 MG tablet Take 100 mg by mouth at bedtime.    [provider]    Family History No family history on  file.  Social History Social History  Substance Use Topics  . Smoking status: Current Every Day Smoker    Packs/day: 0.50    Types: Cigarettes  . Smokeless tobacco: Never Used  . Alcohol use No     Allergies   Patient has no known allergies.   Review of Systems Review of Systems  All other systems reviewed and are negative.    Physical Exam Updated Vital Signs BP 107/62 (BP Location: Right Arm)   Pulse 64   Temp 97.8 F (36.6 C) (Oral)   Resp 16   Ht 1.702 m (5\' 7" )   Wt 78 kg (172 lb)   LMP 05/11/2017   SpO2 100%   BMI 26.94 kg/m   Physical Exam  Constitutional: She is oriented to person, place, and time. She appears well-developed and well-nourished.  Non-toxic appearance. No distress.  HENT:  Head: Normocephalic and atraumatic.  Eyes: Conjunctivae, EOM and lids are normal. Pupils are equal, round, and reactive to light.  Neck: Normal range of motion. Neck supple. No tracheal deviation present. No thyroid mass present.  Cardiovascular: Normal rate, regular rhythm and normal heart sounds.  Exam reveals no gallop.   No murmur heard. Pulmonary/Chest: Effort normal and breath sounds normal. No stridor. No respiratory distress. She has no decreased breath sounds. She has no wheezes. She has no rhonchi. She has no rales.  Abdominal: Soft. Normal appearance and bowel sounds are normal. She exhibits no distension. There is no tenderness. There is no rebound and no CVA tenderness.  Musculoskeletal: Normal range of motion. She exhibits no edema or tenderness.       Back:  Neurological: She is alert and oriented to person, place, and time. She has normal strength. No cranial nerve deficit or sensory deficit. GCS eye subscore is 4. GCS verbal subscore is 5. GCS motor subscore is 6.  Skin: Skin is warm and dry. No abrasion and no rash noted.  Psychiatric: She has a normal mood and affect. Her speech is normal and behavior is normal.  Nursing note and vitals  reviewed.    ED Treatments / Results  Labs (all labs ordered are listed, but only abnormal results are displayed) Labs Reviewed  BASIC METABOLIC PANEL - Abnormal; Notable for the following:       Result Value   Creatinine, Ser 1.02 (*)    All other components within normal limits  CBC  I-STAT TROPOININ, ED    EKG  EKG Interpretation None       Radiology Dg Chest 2 View  Result Date: 06/02/2017 CLINICAL DATA:  Left-sided chest pain radiating to back since last Wednesday. EXAM: CHEST  2 VIEW COMPARISON:  Chest x-ray dated 03/24/2016. FINDINGS: Cardiomediastinal silhouette is normal in size and configuration. Lungs are clear. Lung volumes are normal. No evidence of pneumonia. No pleural effusion. No pneumothorax. Osseous and soft tissue structures about the chest are unremarkable. IMPRESSION: Normal chest x-ray. Electronically Signed   By: Bary Richard M.D.   On: 06/02/2017 11:56    Procedures Procedures (including critical care time)  Medications Ordered in ED Medications  methocarbamol (ROBAXIN) tablet 500 mg (not administered)  ketorolac (TORADOL) injection 60 mg (60 mg Intramuscular Given 06/02/17 1245)     Initial Impression / Assessment and Plan / ED Course  I have reviewed the triage vital signs and the nursing notes.  Pertinent labs & imaging results that were available during my care of the patient were reviewed by me and considered in my medical decision making (see chart for details).     ED ECG REPORT   Date: 06/02/2017  Rate: 78  Rhythm: normal sinus rhythm  QRS Axis: normal  Intervals: normal  ST/T Wave abnormalities: normal  Conduction Disutrbances:none  Narrative Interpretation:   Old EKG Reviewed: none available  I have personally reviewed the EKG tracing and agree with the computerized printout as noted.   Final Clinical Impressions(s) / ED Diagnoses   Final diagnoses:  None   Patient given Toradol and Robaxin and feels better. Suspect  musculoskeletal pain. We'll discharge home with return precautions New Prescriptions New Prescriptions   No medications on file     Lorre Nick, MD 06/02/17 1414

## 2017-06-12 ENCOUNTER — Encounter (HOSPITAL_COMMUNITY): Payer: Self-pay | Admitting: Emergency Medicine

## 2017-06-12 ENCOUNTER — Other Ambulatory Visit: Payer: Self-pay

## 2017-06-12 ENCOUNTER — Emergency Department (HOSPITAL_COMMUNITY)
Admission: EM | Admit: 2017-06-12 | Discharge: 2017-06-12 | Disposition: A | Discharge status: Home / Self Care | Payer: 59 | Attending: Emergency Medicine | Admitting: Emergency Medicine

## 2017-06-12 ENCOUNTER — Emergency Department (HOSPITAL_COMMUNITY): Payer: 59

## 2017-06-12 DIAGNOSIS — R0789 Other chest pain: Secondary | ICD-10-CM | POA: Insufficient documentation

## 2017-06-12 DIAGNOSIS — R0602 Shortness of breath: Secondary | ICD-10-CM | POA: Diagnosis not present

## 2017-06-12 DIAGNOSIS — Z79899 Other long term (current) drug therapy: Secondary | ICD-10-CM | POA: Diagnosis not present

## 2017-06-12 DIAGNOSIS — F17210 Nicotine dependence, cigarettes, uncomplicated: Secondary | ICD-10-CM | POA: Diagnosis not present

## 2017-06-12 DIAGNOSIS — R079 Chest pain, unspecified: Secondary | ICD-10-CM | POA: Diagnosis not present

## 2017-06-12 HISTORY — DX: Anxiety disorder, unspecified: F41.9

## 2017-06-12 HISTORY — DX: Post-traumatic stress disorder, unspecified: F43.10

## 2017-06-12 LAB — CBC
HCT: 39.5 % (ref 36.0–46.0)
HEMOGLOBIN: 13.9 g/dL (ref 12.0–15.0)
MCH: 33.2 pg (ref 26.0–34.0)
MCHC: 35.2 g/dL (ref 30.0–36.0)
MCV: 94.3 fL (ref 78.0–100.0)
Platelets: 217 10*3/uL (ref 150–400)
RBC: 4.19 MIL/uL (ref 3.87–5.11)
RDW: 12 % (ref 11.5–15.5)
WBC: 5.7 10*3/uL (ref 4.0–10.5)

## 2017-06-12 LAB — BASIC METABOLIC PANEL
ANION GAP: 8 (ref 5–15)
BUN: 7 mg/dL (ref 6–20)
CALCIUM: 9.7 mg/dL (ref 8.9–10.3)
CO2: 26 mmol/L (ref 22–32)
Chloride: 102 mmol/L (ref 101–111)
Creatinine, Ser: 0.89 mg/dL (ref 0.44–1.00)
GFR calc Af Amer: >60 mL/min (ref >60)
GLUCOSE: 92 mg/dL (ref 65–99)
Potassium: 4.3 mmol/L (ref 3.5–5.1)
Sodium: 136 mmol/L (ref 135–145)

## 2017-06-12 LAB — POCT I-STAT TROPONIN I: TROPONIN I, POC: 0 ng/mL (ref 0.00–0.08)

## 2017-06-12 MED ORDER — HYDROXYZINE HCL 25 MG PO TABS
25.0000 mg | ORAL_TABLET | Freq: Once | ORAL | Status: AC
  Administered 2017-06-12: 25 mg via ORAL
  Filled 2017-06-12: qty 1

## 2017-06-12 NOTE — ED Notes (Signed)
Patient reports numbness around mouth and neck area that started after taking Robaxin. States she was prescribed robaxin on last week due to some chest wall pain and back pain. C/O back pain, rib pain, right chest pain. Neuro intact.

## 2017-06-12 NOTE — ED Notes (Signed)
Pt had drawn in triage: Blue Gold Lavender Lt green  Dark green x2 

## 2017-06-12 NOTE — ED Triage Notes (Signed)
Pt states that she has had chest pain x 1 week and was dx with chest wall pain and given robaxin. States that she started having bilateral facial numbness and headache on Monday. Alert and oriented. Neuro intact.

## 2017-06-12 NOTE — ED Provider Notes (Signed)
[facility] Provider Note   CSN: [id number] Arrival date & time: 06/12/17  1044     History   Chief Complaint Chief Complaint  Patient presents with  . Chest Pain  . Numbness    HPI Jessica Marshall is a 36 y.o. female.  HPI   36 year old female with history of anxiety, depression, PTSD presenting complaining of chest pain. Patient has been reporting persistent anterior chest pressure with tingling and numbness sensation starting from the jaw for mild extending towards her neck and her chest which has been ongoing for the past week. Symptom has not gotten any better. She was seen in the ED a week ago for her complaint and was diagnosed with chest wall pain. She was given Robaxin for her symptoms. She was taking the medication when the symptoms appears. She discussed this with her primary care provider who told her to stop taking Robaxin. She has stopped taking the medication but the symptom has not resolved. She does admits to having increasing stress lately "I have a lot, mind". Denies SI or HI. She is a smoker. Denies any history of cardiac disease. No prior history of PE or DVT, no recent surgery, prolonged bed rest, taking oral hormone, having legs swelling or calf pain. Denies any strenuous activity or any recent injury.  Past Medical History:  Diagnosis Date  . Anxiety   . Depression   . PTSD (post-traumatic stress disorder)     There are no active problems to display for this patient.   No past surgical history on file.  OB History    No data available       Home Medications    Prior to Admission medications   Medication Sig Start Date End Date Taking? Authorizing Provider  ibuprofen (ADVIL,MOTRIN) 600 MG tablet Take 1 tablet (600 mg total) by mouth every 6 (six) hours as needed. 06/02/17   Lorre NickAllen, Anthony, MD  methocarbamol (ROBAXIN-750) 750 MG tablet Take 1 tablet (750 mg total) by mouth 4 (four) times daily. 06/02/17   Lorre NickAllen, Anthony, MD  sertraline  (ZOLOFT) 100 MG tablet Take 100 mg by mouth daily.     [provider]  traZODone (DESYREL) 100 MG tablet Take 100 mg by mouth at bedtime.    [provider]    Family History No family history on file.  Social History Social History  Substance Use Topics  . Smoking status: Current Every Day Smoker    Packs/day: 0.50    Types: Cigarettes  . Smokeless tobacco: Never Used  . Alcohol use No     Allergies   Patient has no known allergies.   Review of Systems Review of Systems  All other systems reviewed and are negative.    Physical Exam Updated Vital Signs BP 119/87 (BP Location: Right Arm)   Pulse 68   Temp 98 F (36.7 C) (Oral)   Resp 16   LMP 06/11/2017   SpO2 100%   Physical Exam  Constitutional: She is oriented to person, place, and time. She appears well-developed and well-nourished. No distress.  Patient with flat affect however in no acute discomfort.  HENT:  Head: Atraumatic.  Eyes: Conjunctivae are normal.  Neck: Neck supple.  Cardiovascular: Normal rate and intact distal pulses.   Pulmonary/Chest: Effort normal and breath sounds normal. No stridor. She exhibits tenderness (Tenderness to palpation of anterior chest wall without any overlying skin changes.).  Abdominal: Soft. She exhibits no distension. There is no tenderness.  Musculoskeletal: She exhibits  no edema.  Neurological: She is alert and oriented to person, place, and time.  Sensation is intact throughout, including periorbital region, anterior neck and anterior chest on gentle palpation.  Skin: No rash noted.  Psychiatric: She has a normal mood and affect.  Nursing note and vitals reviewed.    ED Treatments / Results  Labs (all labs ordered are listed, but only abnormal results are displayed) Labs Reviewed  BASIC METABOLIC PANEL  CBC  I-STAT TROPONIN, ED  POCT I-STAT TROPONIN I    EKG  EKG Interpretation None      Date: 06/12/2017  Rate: 72  Rhythm: normal  sinus rhythm  QRS Axis: normal  Intervals: normal  ST/T Wave abnormalities: normal  Conduction Disutrbances: none  Narrative Interpretation:   Old EKG Reviewed by me: No significant changes noted     Radiology Dg Chest 2 View  Result Date: 06/12/2017 CLINICAL DATA:  Chest pain and shortness of Breath EXAM: CHEST  2 VIEW COMPARISON:  06/02/2017 FINDINGS: Cardiac shadow is within normal limits. The lungs are well aerated bilaterally. No focal infiltrate or sizable effusion is seen. No acute bony abnormality is noted. IMPRESSION: No active cardiopulmonary disease. Electronically Signed   By: Alcide Clever M.D.   On: 06/12/2017 11:29    Procedures Procedures (including critical care time)  Medications Ordered in ED Medications  hydrOXYzine (ATARAX/VISTARIL) tablet 25 mg (25 mg Oral Given 06/12/17 1321)     Initial Impression / Assessment and Plan / ED Course  I have reviewed the triage vital signs and the nursing notes.  Pertinent labs & imaging results that were available during my care of the patient were reviewed by me and considered in my medical decision making (see chart for details).     BP 138/85   Pulse (!) 55   Temp 98 F (36.7 C) (Oral)   Resp 16   LMP 06/11/2017   SpO2 100%    Final Clinical Impressions(s) / ED Diagnoses   Final diagnoses:  Chest wall pain    New Prescriptions New Prescriptions   No medications on file   11:57 AM Patient here with persistent reproducible chest wall pain. Symptoms atypical for ACS. Her blood work today is unremarkable. HEART score of 1, low risk of MACE.  PERC negative, doubt PE.  2:10 PM Patient report minimal improvement after taking Vistaril. She is resting comfortably. Labs are reassuring, no acute pathology identified. At this time she is stable for discharge with close follow-up. She does have an appointment to be seen by her PCP in one week. Return precaution discussed.   Fayrene Helper, PA-C 06/12/17 1412      Shaune Pollack, MD 06/13/17 [phone]

## 2017-06-18 DIAGNOSIS — Z5181 Encounter for therapeutic drug level monitoring: Secondary | ICD-10-CM | POA: Diagnosis not present

## 2017-07-17 MED ORDER — IOPAMIDOL (ISOVUE-300) INJECTION 61%
INTRAVENOUS | Status: AC
  Filled 2017-07-17: qty 30

## 2017-08-26 DIAGNOSIS — Z23 Encounter for immunization: Secondary | ICD-10-CM | POA: Diagnosis not present

## 2017-12-01 ENCOUNTER — Encounter (HOSPITAL_COMMUNITY): Payer: Self-pay | Admitting: Family Medicine

## 2017-12-01 ENCOUNTER — Ambulatory Visit (HOSPITAL_COMMUNITY)
Admission: EM | Admit: 2017-12-01 | Discharge: 2017-12-01 | Disposition: A | Discharge status: Home / Self Care | Payer: 59

## 2017-12-01 DIAGNOSIS — K529 Noninfective gastroenteritis and colitis, unspecified: Secondary | ICD-10-CM | POA: Diagnosis not present

## 2017-12-01 MED ORDER — ONDANSETRON 4 MG PO TBDP
8.0000 mg | ORAL_TABLET | Freq: Once | ORAL | Status: AC
  Administered 2017-12-01: 8 mg via ORAL

## 2017-12-01 MED ORDER — ONDANSETRON 4 MG PO TBDP
ORAL_TABLET | ORAL | Status: AC
  Filled 2017-12-01: qty 2

## 2017-12-01 NOTE — Discharge Instructions (Signed)
A lot of water or sugar-free Gatorade.  If diarrhea continues, may use imodium over the counter May use BRAT diet: Which consist of banana, rice, applesauce and toast.

## 2017-12-01 NOTE — ED Triage Notes (Signed)
Pt here for abd pain, N,V,D since yesterday. Unable to eat.

## 2017-12-01 NOTE — ED Provider Notes (Signed)
[facility]    CSN: [id number] Arrival date & time: 12/01/17  1200     History   Chief Complaint Chief Complaint  Patient presents with  . Abdominal Pain  . Emesis  . Diarrhea    HPI Jessica Marshall is a 37 y.o. female.   37 y.o female, had to leave work early yesterday due to abdominal pain, aausea, vomiting and diarrhea. Her abdominal pain is worst today which prompted her to be seem. However diarrhea and vomiting have both improved. She continues to remain nauseous. Denies blood in stool or emesis. Abdominal pain is described as cramping all over. Denies sick exposure. Denies dining out recently. Endorses decreased in appetite but able to keep fluid down. She is afebrile.       Past Medical History:  Diagnosis Date  . Anxiety   . Depression   . PTSD (post-traumatic stress disorder)     There are no active problems to display for this patient.   History reviewed. No pertinent surgical history.  OB History    No data available       Home Medications    Prior to Admission medications   Medication Sig Start Date End Date Taking? Authorizing Provider  clonazePAM (KLONOPIN) 0.5 MG tablet Take 0.5 mg by mouth 2 (two) times daily as needed for anxiety.   Yes [provider]  ibuprofen (ADVIL,MOTRIN) 600 MG tablet Take 1 tablet (600 mg total) by mouth every 6 (six) hours as needed. Patient taking differently: Take 600 mg by mouth every 6 (six) hours as needed for moderate pain.  06/02/17   Lorre Nick, MD  sertraline (ZOLOFT) 100 MG tablet Take 100 mg by mouth daily.     [provider]  traZODone (DESYREL) 100 MG tablet Take 100 mg by mouth at bedtime.    [provider]    Family History History reviewed. No pertinent family history.  Social History Social History   Tobacco Use  . Smoking status: Current Every Day Smoker    Packs/day: 0.50    Types: Cigarettes  . Smokeless tobacco: Never Used  Substance Use Topics    . Alcohol use: No  . Drug use: No     Allergies   Patient has no known allergies.   Review of Systems Review of Systems  Constitutional:       See HPI     Physical Exam Triage Vital Signs ED Triage Vitals  Enc Vitals Group     BP 12/01/17 1217 105/66     Pulse Rate 12/01/17 1217 70     Resp 12/01/17 1217 18     Temp 12/01/17 1217 97.7 F (36.5 C)     Temp src --      SpO2 12/01/17 1217 100 %     Weight --      Height --      Head Circumference --      Peak Flow --      Pain Score 12/01/17 1215 7     Pain Loc --      Pain Edu? --      Excl. in GC? --    No data found.  Updated Vital Signs BP 105/66   Pulse 70   Temp 97.7 F (36.5 C)   Resp 18   LMP 11/05/2017   SpO2 100%   Visual Acuity Right Eye Distance:   Left Eye Distance:   Bilateral Distance:    Right Eye Near:  Left Eye Near:    Bilateral Near:     Physical Exam  Constitutional: She appears well-developed and well-nourished. No distress.  HENT:  Head: Normocephalic and atraumatic.  Lips and Mouth are dry  Cardiovascular: Normal rate, regular rhythm and normal heart sounds.  Pulmonary/Chest: Effort normal and breath sounds normal. She has no wheezes.  Abdominal: Normal appearance and bowel sounds are normal. There is tenderness (at the lower abdomen).  Skin: Skin is warm and dry.  Nursing note and vitals reviewed.    UC Treatments / Results  Labs (all labs ordered are listed, but only abnormal results are displayed) Labs Reviewed - No data to display  EKG  EKG Interpretation None      Radiology No results found.  Procedures Procedures (including critical care time)  Medications Ordered in UC Medications  ondansetron (ZOFRAN-ODT) disintegrating tablet 8 mg (8 mg Oral Given 12/01/17 1245)   Initial Impression / Assessment and Plan / UC Course  I have reviewed the triage vital signs and the nursing notes.  Pertinent labs & imaging results that were available during my  care of the patient were reviewed by me and considered in my medical decision making (see chart for details).  Final Clinical Impressions(s) / UC Diagnoses   Final diagnoses:  Gastroenteritis   Education provided. Education handout given.  Informed the importance of hydration. Advised to drink plenty of water or sugar free gatorade.  If diarrhea continues, may use Imodium OTC.  Also educated on BRAT diet and advanced as tolerated.  Zofran given in office. Zofran rx offered but patient declined.  Work note given by per request.  ED Discharge Orders    None     Controlled Substance Prescriptions [place] Controlled Substance Registry consulted? Not Applicable   Lucia EstelleZheng, Mordche Hedglin, NP 12/01/17 1247

## 2017-12-04 ENCOUNTER — Other Ambulatory Visit: Payer: Self-pay

## 2017-12-04 ENCOUNTER — Encounter (HOSPITAL_COMMUNITY): Payer: Self-pay | Admitting: Family Medicine

## 2017-12-04 ENCOUNTER — Encounter (HOSPITAL_COMMUNITY): Payer: Self-pay | Admitting: Emergency Medicine

## 2017-12-04 ENCOUNTER — Ambulatory Visit (HOSPITAL_COMMUNITY)
Admission: EM | Admit: 2017-12-04 | Discharge: 2017-12-04 | Disposition: A | Discharge status: Home / Self Care | Payer: 59

## 2017-12-04 ENCOUNTER — Emergency Department (HOSPITAL_COMMUNITY)
Admission: EM | Admit: 2017-12-04 | Discharge: 2017-12-04 | Disposition: A | Discharge status: Home / Self Care | Payer: 59 | Attending: Emergency Medicine | Admitting: Emergency Medicine

## 2017-12-04 DIAGNOSIS — R197 Diarrhea, unspecified: Secondary | ICD-10-CM | POA: Diagnosis not present

## 2017-12-04 DIAGNOSIS — R1031 Right lower quadrant pain: Secondary | ICD-10-CM

## 2017-12-04 DIAGNOSIS — R1011 Right upper quadrant pain: Secondary | ICD-10-CM | POA: Insufficient documentation

## 2017-12-04 DIAGNOSIS — R112 Nausea with vomiting, unspecified: Secondary | ICD-10-CM | POA: Diagnosis not present

## 2017-12-04 DIAGNOSIS — Z5321 Procedure and treatment not carried out due to patient leaving prior to being seen by health care provider: Secondary | ICD-10-CM | POA: Insufficient documentation

## 2017-12-04 LAB — CBC
HCT: 41.3 % (ref 36.0–46.0)
Hemoglobin: 14.5 g/dL (ref 12.0–15.0)
MCH: 32.7 pg (ref 26.0–34.0)
MCHC: 35.1 g/dL (ref 30.0–36.0)
MCV: 93 fL (ref 78.0–100.0)
PLATELETS: 255 10*3/uL (ref 150–400)
RBC: 4.44 MIL/uL (ref 3.87–5.11)
RDW: 12.5 % (ref 11.5–15.5)
WBC: 5.7 10*3/uL (ref 4.0–10.5)

## 2017-12-04 LAB — COMPREHENSIVE METABOLIC PANEL
ALK PHOS: 104 U/L (ref 38–126)
ALT: 12 U/L — AB (ref 14–54)
AST: 16 U/L (ref 15–41)
Albumin: 4.4 g/dL (ref 3.5–5.0)
Anion gap: 11 (ref 5–15)
BILIRUBIN TOTAL: 1.1 mg/dL (ref 0.3–1.2)
BUN: 8 mg/dL (ref 6–20)
CALCIUM: 9.8 mg/dL (ref 8.9–10.3)
CO2: 21 mmol/L — ABNORMAL LOW (ref 22–32)
CREATININE: 1.03 mg/dL — AB (ref 0.44–1.00)
Chloride: 103 mmol/L (ref 101–111)
Glucose, Bld: 76 mg/dL (ref 65–99)
Potassium: 3.8 mmol/L (ref 3.5–5.1)
Sodium: 135 mmol/L (ref 135–145)
TOTAL PROTEIN: 7.6 g/dL (ref 6.5–8.1)

## 2017-12-04 LAB — URINALYSIS, ROUTINE W REFLEX MICROSCOPIC
BILIRUBIN URINE: NEGATIVE
Bacteria, UA: NONE SEEN
GLUCOSE, UA: NEGATIVE mg/dL
KETONES UR: 80 mg/dL — AB
LEUKOCYTES UA: NEGATIVE
Nitrite: NEGATIVE
PH: 6 (ref 5.0–8.0)
Protein, ur: 30 mg/dL — AB
SPECIFIC GRAVITY, URINE: 1.029 (ref 1.005–1.030)

## 2017-12-04 LAB — I-STAT BETA HCG BLOOD, ED (MC, WL, AP ONLY): I-stat hCG, quantitative: <5 m[IU]/mL (ref <5)

## 2017-12-04 LAB — LIPASE, BLOOD: Lipase: 56 U/L — ABNORMAL HIGH (ref 11–51)

## 2017-12-04 NOTE — Discharge Instructions (Signed)
Due to worsening of symptoms (increased abdominal pain with nausea and vomiting), recommend go to the ER now for further evaluation.

## 2017-12-04 NOTE — ED Notes (Signed)
Called Pt for vitals no answer. 

## 2017-12-04 NOTE — ED Provider Notes (Signed)
[facility]    CSN: [id number] Arrival date & time: 12/04/17  1030     History   Chief Complaint Chief Complaint  Patient presents with  . Abdominal Pain  . Emesis  . Diarrhea    HPI Jessica Marshall is a 37 y.o. female.   37 year old female presents with worsening of symptoms. She first started with diarrhea on Monday (5 days ago) and mild right and central lower abdominal pain. Then she experienced nausea the next day and continued abdominal pain and came to Urgent Care for evaluation. She was dx with Gastroenteritis and given Zofran orally in Urgent Care. She declined an Rx for Zofran at the time. She vomited the Zofran up shortly after being discharged. She continued experiencing more severe abdominal pain, nausea and vomiting. She has been drinking water but unable to keep food down. She denies any fever, sinus, urinary or vaginal symptoms. She has taken Tylenol with minimal relief. She has a history of depression and anxiety and is currently on Zoloft, Trazadone and Klonopin prn. She does smoke cigarettes daily. She is concerned over the worsening of symptoms in the past 5 days.    The history is provided by the patient.    Past Medical History:  Diagnosis Date  . Anxiety   . Depression   . PTSD (post-traumatic stress disorder)     There are no active problems to display for this patient.   History reviewed. No pertinent surgical history.  OB History    No data available       Home Medications    Prior to Admission medications   Medication Sig Start Date End Date Taking? Authorizing Provider  clonazePAM (KLONOPIN) 0.5 MG tablet Take 0.5 mg by mouth 2 (two) times daily as needed for anxiety.    [provider]  ibuprofen (ADVIL,MOTRIN) 600 MG tablet Take 1 tablet (600 mg total) by mouth every 6 (six) hours as needed. Patient taking differently: Take 600 mg by mouth every 6 (six) hours as needed for moderate pain.  06/02/17   Lorre Nick,  MD  sertraline (ZOLOFT) 100 MG tablet Take 100 mg by mouth daily.     [provider]  traZODone (DESYREL) 100 MG tablet Take 100 mg by mouth at bedtime.    [provider]    Family History History reviewed. No pertinent family history.  Social History Social History   Tobacco Use  . Smoking status: Current Every Day Smoker    Packs/day: 0.50    Types: Cigarettes  . Smokeless tobacco: Never Used  Substance Use Topics  . Alcohol use: No  . Drug use: No     Allergies   Patient has no known allergies.   Review of Systems Review of Systems  Constitutional: Positive for appetite change, chills and fatigue. Negative for diaphoresis and fever.  HENT: Negative for congestion, ear pain, mouth sores, postnasal drip, rhinorrhea, sinus pressure, sinus pain, sore throat and trouble swallowing.   Respiratory: Negative for cough, chest tightness, shortness of breath and wheezing.   Gastrointestinal: Positive for abdominal pain, diarrhea, nausea and vomiting. Negative for blood in stool.  Genitourinary: Negative for decreased urine volume, difficulty urinating, flank pain, frequency, genital sores, hematuria, pelvic pain, vaginal bleeding, vaginal discharge and vaginal pain.  Musculoskeletal: Negative for arthralgias, back pain, myalgias and neck pain.  Skin: Negative for rash and wound.  Neurological: Negative for dizziness, tremors, seizures, syncope, weakness, light-headedness and numbness.  Hematological: Negative for adenopathy.  Does not bruise/bleed easily.     Physical Exam Triage Vital Signs ED Triage Vitals  Enc Vitals Group     BP 12/04/17 1105 115/83     Pulse Rate 12/04/17 1105 73     Resp 12/04/17 1105 18     Temp 12/04/17 1105 98 F (36.7 C)     Temp src --      SpO2 12/04/17 1105 97 %     Weight --      Height --      Head Circumference --      Peak Flow --      Pain Score 12/04/17 1104 9     Pain Loc --      Pain Edu? --      Excl. in GC?  --    No data found.  Updated Vital Signs BP 115/83   Pulse 73   Temp 98 F (36.7 C)   Resp 18   LMP 11/05/2017   SpO2 97%   Visual Acuity Right Eye Distance:   Left Eye Distance:   Bilateral Distance:    Right Eye Near:   Left Eye Near:    Bilateral Near:     Physical Exam  Constitutional: She is oriented to person, place, and time. She appears well-developed and well-nourished. She appears ill. No distress.  She is lying down on the exam table and appears in significant pain.   HENT:  Head: Normocephalic and atraumatic.  Right Ear: Hearing, tympanic membrane, external ear and ear canal normal.  Left Ear: Hearing, tympanic membrane, external ear and ear canal normal.  Nose: Nose normal.  Mouth/Throat: Uvula is midline and oropharynx is clear and moist. Mucous membranes are dry. No oropharyngeal exudate.  Eyes: Conjunctivae and EOM are normal. Pupils are equal, round, and reactive to light.  Neck: Normal range of motion. Neck supple.  Cardiovascular: Normal rate, regular rhythm and normal heart sounds.  No murmur heard. Pulmonary/Chest: Effort normal and breath sounds normal. No respiratory distress. She has no decreased breath sounds. She has no wheezes. She has no rhonchi.  Abdominal: Soft. Normal appearance. She exhibits no abdominal bruit and no mass. Bowel sounds are decreased. There is no hepatosplenomegaly. There is tenderness in the right lower quadrant and suprapubic area. There is guarding. There is no rigidity and no CVA tenderness.  Musculoskeletal: Normal range of motion.  Lymphadenopathy:    She has no cervical adenopathy.  Neurological: She is alert and oriented to person, place, and time.  Skin: Skin is warm and dry. Capillary refill takes less than 2 seconds. No rash noted.  Psychiatric: She has a normal mood and affect. Her behavior is normal. Judgment and thought content normal.     UC Treatments / Results  Labs (all labs ordered are listed, but only  abnormal results are displayed) Labs Reviewed - No data to display  EKG  EKG Interpretation None       Radiology No results found.  Procedures Procedures (including critical care time)  Medications Ordered in UC Medications - No data to display   Initial Impression / Assessment and Plan / UC Course  I have reviewed the triage vital signs and the nursing notes.  Pertinent labs & imaging results that were available during my care of the patient were reviewed by me and considered in my medical decision making (see chart for details).   Due to worsening of symptoms including right lower abdominal pain, recommend go to the ER now for  further evaluation. Concerned over possible appendicitis. Patient understands and agrees with plan. She will go by car to the ER now.   Final Clinical Impressions(s) / UC Diagnoses   Final diagnoses:  Right lower quadrant abdominal pain  Nausea vomiting and diarrhea    ED Discharge Orders    None       Controlled Substance Prescriptions [place] Controlled Substance Registry consulted? Not Applicable   Sudie Grumbling, NP 12/04/17 1305

## 2017-12-04 NOTE — ED Triage Notes (Signed)
Pt here for abd pain, N,V,D that has been ongoing for the past week and getting worse. Was seen here a few days ago.

## 2017-12-04 NOTE — ED Triage Notes (Signed)
Pt to ER for RUQ and RLQ abdominal pain x5 days with n/v/d, states 6 episodes emesis in 24 hours and "too many to count" episodes of diarrhea. NAD at this time. [facility] and was told to come here today. VSS.

## 2017-12-05 ENCOUNTER — Emergency Department (HOSPITAL_COMMUNITY)
Admission: EM | Admit: 2017-12-05 | Discharge: 2017-12-05 | Disposition: A | Discharge status: Home / Self Care | Payer: 59 | Attending: Emergency Medicine | Admitting: Emergency Medicine

## 2017-12-05 ENCOUNTER — Encounter (HOSPITAL_COMMUNITY): Payer: Self-pay | Admitting: Emergency Medicine

## 2017-12-05 ENCOUNTER — Emergency Department (HOSPITAL_COMMUNITY): Payer: 59

## 2017-12-05 DIAGNOSIS — N83201 Unspecified ovarian cyst, right side: Secondary | ICD-10-CM | POA: Diagnosis not present

## 2017-12-05 DIAGNOSIS — R1031 Right lower quadrant pain: Secondary | ICD-10-CM | POA: Diagnosis present

## 2017-12-05 DIAGNOSIS — F17210 Nicotine dependence, cigarettes, uncomplicated: Secondary | ICD-10-CM | POA: Diagnosis not present

## 2017-12-05 DIAGNOSIS — R109 Unspecified abdominal pain: Secondary | ICD-10-CM | POA: Diagnosis not present

## 2017-12-05 LAB — CBC
HCT: 38.3 % (ref 36.0–46.0)
HEMOGLOBIN: 13.7 g/dL (ref 12.0–15.0)
MCH: 32.9 pg (ref 26.0–34.0)
MCHC: 35.8 g/dL (ref 30.0–36.0)
MCV: 92.1 fL (ref 78.0–100.0)
Platelets: 207 10*3/uL (ref 150–400)
RBC: 4.16 MIL/uL (ref 3.87–5.11)
RDW: 12.3 % (ref 11.5–15.5)
WBC: 5.7 10*3/uL (ref 4.0–10.5)

## 2017-12-05 LAB — COMPREHENSIVE METABOLIC PANEL
ALBUMIN: 4.2 g/dL (ref 3.5–5.0)
ALK PHOS: 101 U/L (ref 38–126)
ALT: 12 U/L — ABNORMAL LOW (ref 14–54)
AST: 16 U/L (ref 15–41)
Anion gap: 8 (ref 5–15)
BILIRUBIN TOTAL: 0.6 mg/dL (ref 0.3–1.2)
BUN: 9 mg/dL (ref 6–20)
CALCIUM: 9.3 mg/dL (ref 8.9–10.3)
CO2: 26 mmol/L (ref 22–32)
Chloride: 101 mmol/L (ref 101–111)
Creatinine, Ser: 0.89 mg/dL (ref 0.44–1.00)
GFR calc Af Amer: >60 mL/min (ref >60)
GFR calc non Af Amer: >60 mL/min (ref >60)
GLUCOSE: 102 mg/dL — AB (ref 65–99)
Potassium: 3 mmol/L — ABNORMAL LOW (ref 3.5–5.1)
Sodium: 135 mmol/L (ref 135–145)
TOTAL PROTEIN: 7.6 g/dL (ref 6.5–8.1)

## 2017-12-05 LAB — URINALYSIS, ROUTINE W REFLEX MICROSCOPIC
Bilirubin Urine: NEGATIVE
GLUCOSE, UA: NEGATIVE mg/dL
Ketones, ur: 20 mg/dL — AB
Leukocytes, UA: NEGATIVE
NITRITE: NEGATIVE
PH: 6 (ref 5.0–8.0)
Protein, ur: NEGATIVE mg/dL
SPECIFIC GRAVITY, URINE: 1.017 (ref 1.005–1.030)

## 2017-12-05 LAB — LIPASE, BLOOD: Lipase: 25 U/L (ref 11–51)

## 2017-12-05 LAB — I-STAT BETA HCG BLOOD, ED (MC, WL, AP ONLY): I-stat hCG, quantitative: <5 m[IU]/mL (ref <5)

## 2017-12-05 MED ORDER — IOPAMIDOL (ISOVUE-300) INJECTION 61%
INTRAVENOUS | Status: AC
  Administered 2017-12-05: 100 mL
  Filled 2017-12-05: qty 100

## 2017-12-05 MED ORDER — HYDROCODONE-ACETAMINOPHEN 5-325 MG PO TABS: 1.0000 | ORAL_TABLET | Freq: Four times a day (QID) | ORAL | 0 refills | Status: DC | PRN

## 2017-12-05 MED ORDER — HYDROMORPHONE HCL 1 MG/ML IJ SOLN
1.0000 mg | Freq: Once | INTRAMUSCULAR | Status: AC
  Administered 2017-12-05: 1 mg via INTRAVENOUS
  Filled 2017-12-05: qty 1

## 2017-12-05 MED ORDER — ONDANSETRON 8 MG PO TBDP: 8.0000 mg | ORAL_TABLET | Freq: Three times a day (TID) | ORAL | 0 refills | Status: DC | PRN

## 2017-12-05 MED ORDER — SODIUM CHLORIDE 0.9 % IV BOLUS (SEPSIS)
2000.0000 mL | Freq: Once | INTRAVENOUS | Status: AC
  Administered 2017-12-05: 2000 mL via INTRAVENOUS

## 2017-12-05 MED ORDER — ONDANSETRON HCL 4 MG/2ML IJ SOLN
4.0000 mg | Freq: Once | INTRAMUSCULAR | Status: AC
  Administered 2017-12-05: 4 mg via INTRAVENOUS
  Filled 2017-12-05: qty 2

## 2017-12-05 NOTE — ED Provider Notes (Signed)
[facility]-EMERGENCY DEPT Provider Note   CSN: [id number] Arrival date & time: 12/05/17  0850     History   Chief Complaint Chief Complaint  Patient presents with  . Abdominal Pain  . Flank Pain  . Nausea  . Emesis    HPI DJENEBA BARSCH is a 37 y.o. female.  HPI Patient is a 37 year old female who presents to the emergency department with 4 days of right-sided and right lower quadrant abdominal pain with associated nausea vomiting diarrhea.  No vomiting blood.  No vomiting or stool.  Symptoms are constant worse with palpation of her right side of her abdomen specifically her right lower quadrant.  No dysuria or urinary frequency.  No vaginal complaints.  Symptoms are moderate in severity.  Patient was seen in urgent care yesterday and his record be seen and evaluated in the emergency department for possible appendicitis.   Past Medical History:  Diagnosis Date  . Anxiety   . Depression   . PTSD (post-traumatic stress disorder)     There are no active problems to display for this patient.   History reviewed. No pertinent surgical history.  OB History    No data available       Home Medications    Prior to Admission medications   Medication Sig Start Date End Date Taking? Authorizing Provider  acetaminophen (TYLENOL) 500 MG tablet Take 500 mg by mouth every 6 (six) hours as needed for mild pain.   Yes [provider]  clonazePAM (KLONOPIN) 0.5 MG tablet Take 0.5 mg by mouth 3 (three) times daily as needed for anxiety.    Yes [provider]  ibuprofen (ADVIL,MOTRIN) 600 MG tablet Take 1 tablet (600 mg total) by mouth every 6 (six) hours as needed. Patient taking differently: Take 600 mg by mouth every 6 (six) hours as needed for moderate pain.  06/02/17  Yes Lorre Nick, MD  sertraline (ZOLOFT) 100 MG tablet Take 100 mg by mouth daily.    Yes [provider]  traZODone (DESYREL) 100 MG tablet Take 100 mg by mouth  at bedtime.   Yes [provider]    Family History No family history on file.  Social History Social History   Tobacco Use  . Smoking status: Current Every Day Smoker    Packs/day: 0.50    Types: Cigarettes  . Smokeless tobacco: Never Used  Substance Use Topics  . Alcohol use: No  . Drug use: No     Allergies   Patient has no known allergies.   Review of Systems Review of Systems  All other systems reviewed and are negative.    Physical Exam Updated Vital Signs BP 111/72 (BP Location: Left Arm)   Pulse 64   Temp 97.7 F (36.5 C) (Oral)   Resp 16   LMP 11/05/2017   SpO2 100%   Physical Exam  Constitutional: She is oriented to person, place, and time. She appears well-developed and well-nourished.  HENT:  Head: Normocephalic.  Eyes: EOM are normal.  Neck: Normal range of motion.  Cardiovascular: Normal rate and regular rhythm.  Pulmonary/Chest: Effort normal and breath sounds normal.  Abdominal: Soft. She exhibits no distension.  Right-sided and right lower quadrant abdominal tenderness without guarding or rebound.  No peritoneal signs.  Musculoskeletal: Normal range of motion.  Neurological: She is alert and oriented to person, place, and time.  Psychiatric: She has a normal mood and affect.  Nursing note and vitals reviewed.  ED Treatments / Results  Labs (all labs ordered are listed, but only abnormal results are displayed) Labs Reviewed  COMPREHENSIVE METABOLIC PANEL - Abnormal; Notable for the following components:      Result Value   Potassium 3.0 (*)    Glucose, Bld 102 (*)    ALT 12 (*)    All other components within normal limits  URINALYSIS, ROUTINE W REFLEX MICROSCOPIC - Abnormal; Notable for the following components:   APPearance HAZY (*)    Hgb urine dipstick MODERATE (*)    Ketones, ur 20 (*)    Bacteria, UA RARE (*)    Squamous Epithelial / LPF 6-30 (*)    All other components within normal limits  URINE CULTURE    LIPASE, BLOOD  CBC  I-STAT BETA HCG BLOOD, ED ([facility], [facility], [facility] ONLY)    EKG  EKG Interpretation None       Radiology Ct Abdomen Pelvis W Contrast  Result Date: 12/05/2017 CLINICAL DATA:  Abdominal pain, right lower quadrant pain EXAM: CT ABDOMEN AND PELVIS WITH CONTRAST TECHNIQUE: Multidetector CT imaging of the abdomen and pelvis was performed using the standard protocol following bolus administration of intravenous contrast. CONTRAST:  100mL ISOVUE-300 IOPAMIDOL (ISOVUE-300) INJECTION 61% COMPARISON:  None. FINDINGS: Lower chest: Lung bases are clear. No effusions. Heart is normal size. Hepatobiliary: No focal hepatic abnormality. Gallbladder unremarkable. Pancreas: No focal abnormality or ductal dilatation. Spleen: No focal abnormality.  Normal size. Adrenals/Urinary Tract: No adrenal abnormality. No focal renal abnormality. No stones or hydronephrosis. Urinary bladder is unremarkable. Stomach/Bowel: Stomach, large and small bowel grossly unremarkable. Appendix is normal. Vascular/Lymphatic: No evidence of aneurysm or adenopathy. Reproductive: 3.5 cm right ovarian cyst. Uterus and left adnexa unremarkable. Other: No free fluid or free air. Musculoskeletal: No acute bony abnormality. IMPRESSION: Normal appendix. Small right ovarian cyst, 3.5 cm. Electronically Signed   By: Charlett NoseKevin  Dover M.D.   On: 12/05/2017 14:00    Procedures Procedures (including critical care time)  Medications Ordered in ED Medications  sodium chloride 0.9 % bolus 2,000 mL (2,000 mLs Intravenous New Bag/Given 12/05/17 1306)  ondansetron (ZOFRAN) injection 4 mg (4 mg Intravenous Given 12/05/17 1309)  HYDROmorphone (DILAUDID) injection 1 mg (1 mg Intravenous Given 12/05/17 1309)  iopamidol (ISOVUE-300) 61 % injection (100 mLs  Contrast Given 12/05/17 1318)     Initial Impression / Assessment and Plan / ED Course  I have reviewed the triage vital signs and the nursing notes.  Pertinent labs & imaging results that were  available during my care of the patient were reviewed by me and considered in my medical decision making (see chart for details).     3:55 PM Symptoms improved at this time.  Suspect ruptured right ovarian cyst.  Outpatient gynecologic follow-up.  Feels much better.  Doubt torsion.  Discharged home in good condition.  Appendix is normal.  Gynecologic follow-up.  Patient understands to return to the ER for new or worsening symptoms  Final Clinical Impressions(s) / ED Diagnoses   Final diagnoses:  Acute abdominal pain  Right ovarian cyst    ED Discharge Orders    None       Azalia Bilisampos, Breaunna Gottlieb, MD 12/05/17 1556

## 2017-12-05 NOTE — ED Triage Notes (Signed)
Patient here from home with complaints of right lower abdominal pain since Monday radiating into right flank. Nausea, vomiting, diarrhea, 6 episodes in the past 24 hours. States that she has been seen for the same.

## 2018-01-01 ENCOUNTER — Ambulatory Visit (HOSPITAL_COMMUNITY): Admission: EM | Admit: 2018-01-01 | Discharge: 2018-01-01 | Discharge status: Against Medical Advice | Payer: 59

## 2018-08-27 DIAGNOSIS — S79922A Unspecified injury of left thigh, initial encounter: Secondary | ICD-10-CM | POA: Diagnosis not present

## 2018-08-27 DIAGNOSIS — S8992XA Unspecified injury of left lower leg, initial encounter: Secondary | ICD-10-CM | POA: Diagnosis not present

## 2018-08-27 DIAGNOSIS — S99922A Unspecified injury of left foot, initial encounter: Secondary | ICD-10-CM | POA: Diagnosis not present

## 2018-08-27 DIAGNOSIS — M79605 Pain in left leg: Secondary | ICD-10-CM | POA: Diagnosis not present

## 2018-08-27 DIAGNOSIS — M79672 Pain in left foot: Secondary | ICD-10-CM | POA: Diagnosis not present

## 2019-02-13 IMAGING — CT CT ABD-PELV W/ CM
2 of 4 series · 17 of 46 positions shown, 19 images · IV contrast (ISOVUE)
Comparison: None.

CLINICAL DATA: Abdominal pain, right lower quadrant pain

EXAM:
CT ABDOMEN AND PELVIS WITH CONTRAST
TECHNIQUE: Multidetector CT imaging of the abdomen and pelvis was performed
using the standard protocol following bolus administration of
intravenous contrast.
CONTRAST:  100mL [id number] IOPAMIDOL ([id number]) INJECTION 61%

[Series 2: axial st · axial · 0.82mm/px · z∈[+978,+1384]mm · 14 of 93 slices shown, 16 images]
[im 6/93  soft-tissue]
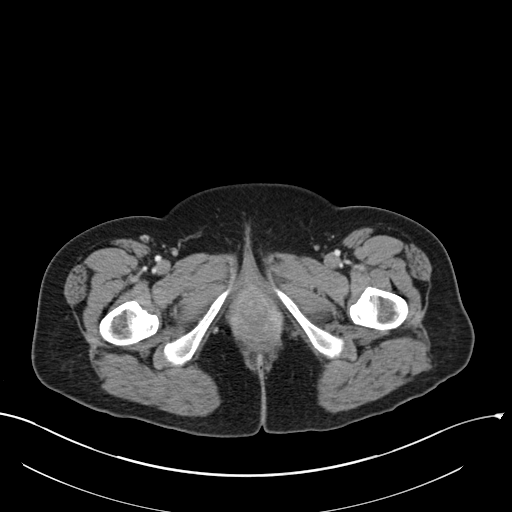
[im 6/93  bone]
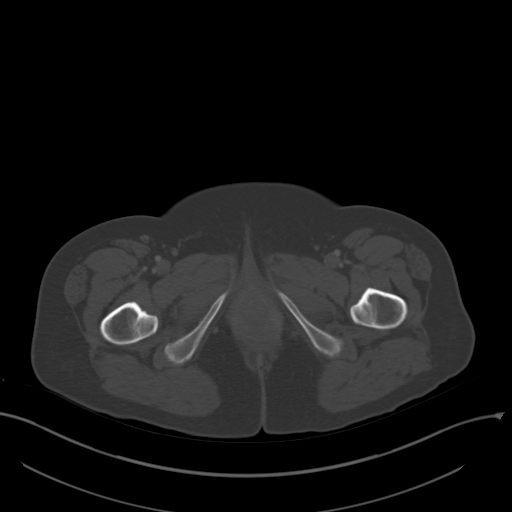
[im 11/93  soft-tissue]
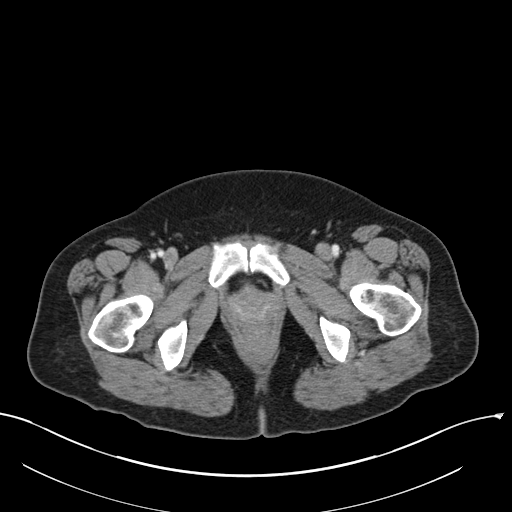
[im 21/93  soft-tissue]
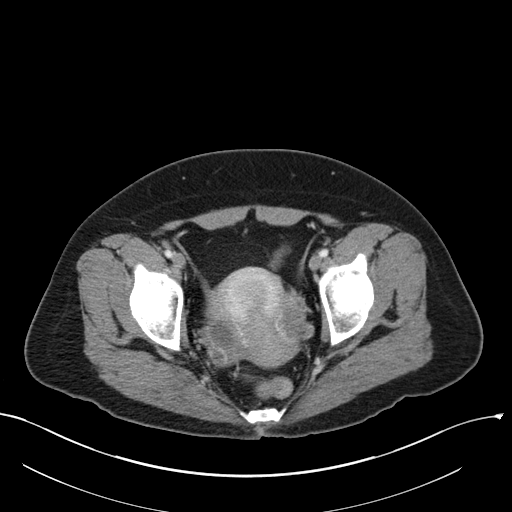
[im 26/93  soft-tissue]
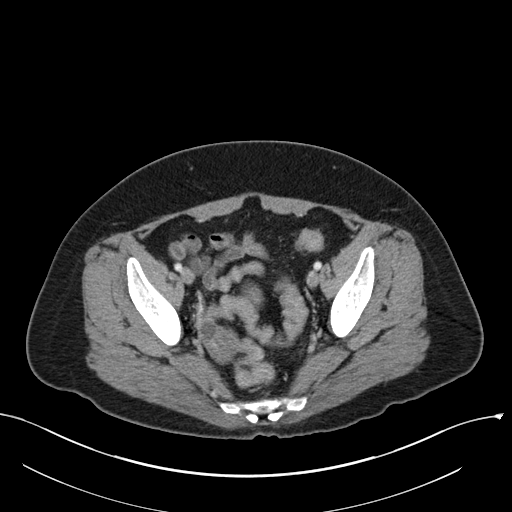
[im 31/93  soft-tissue]
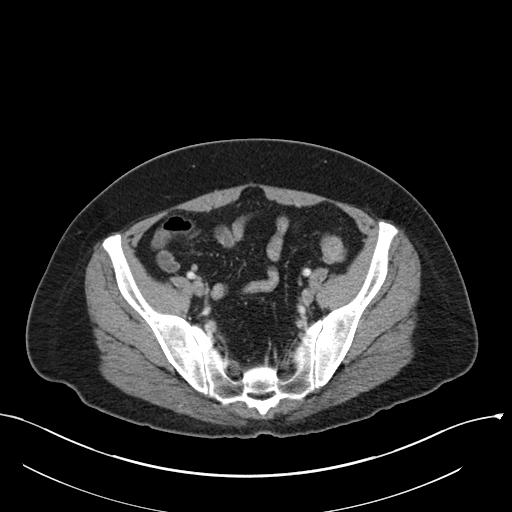
[im 36/93  soft-tissue]
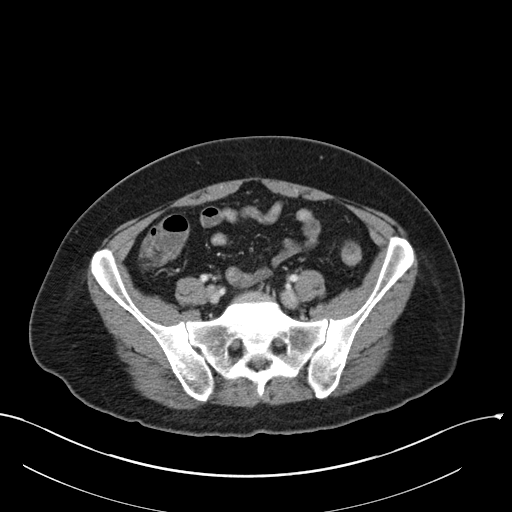
[im 41/93  soft-tissue]
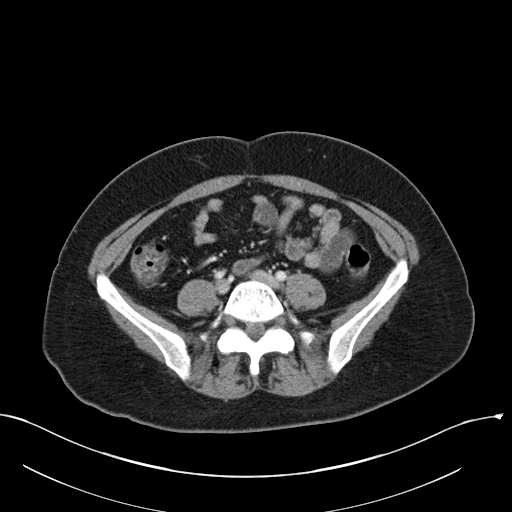
[im 52/93  soft-tissue]
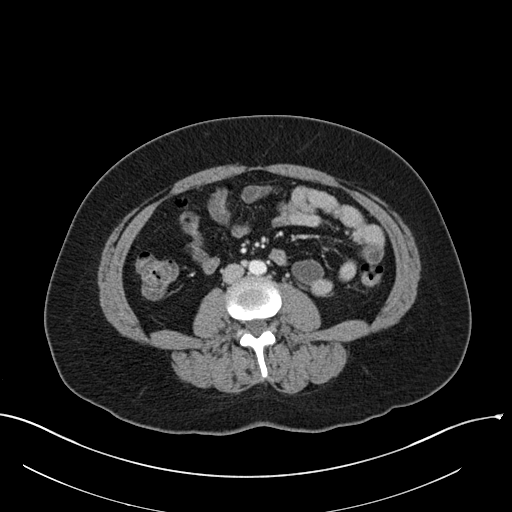
[im 57/93  soft-tissue]
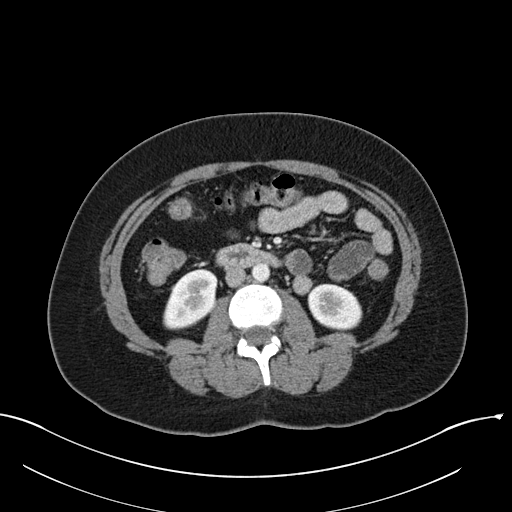
[im 57/93  bone]
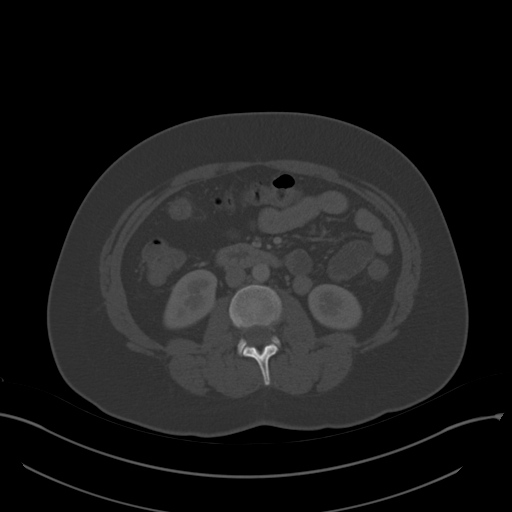
[im 62/93  soft-tissue]
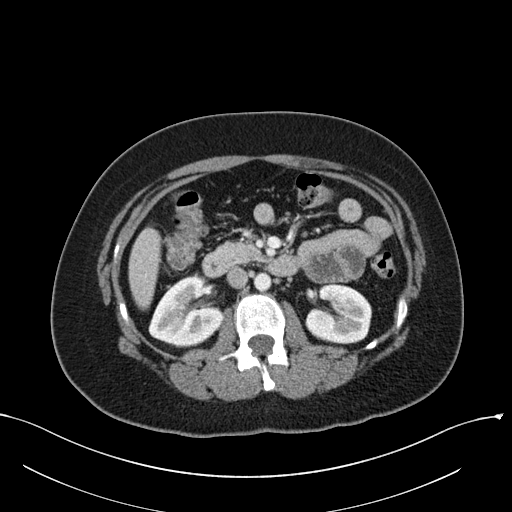
[im 67/93  soft-tissue]
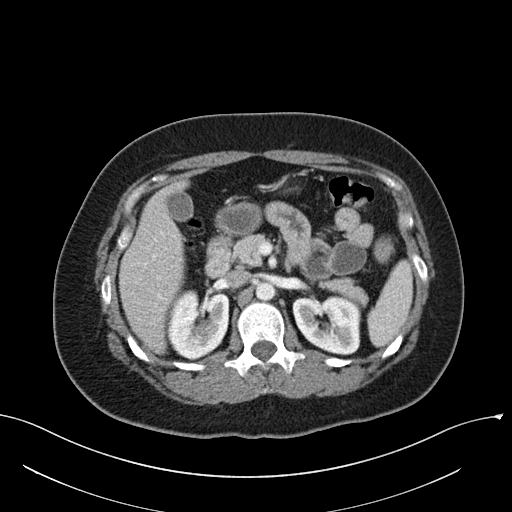
[im 72/93  soft-tissue]
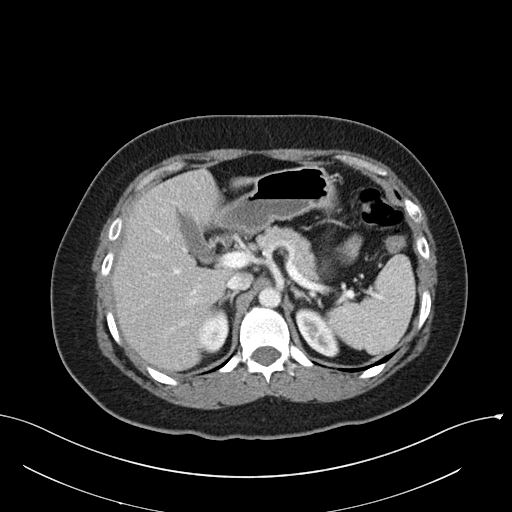
[im 82/93  soft-tissue]
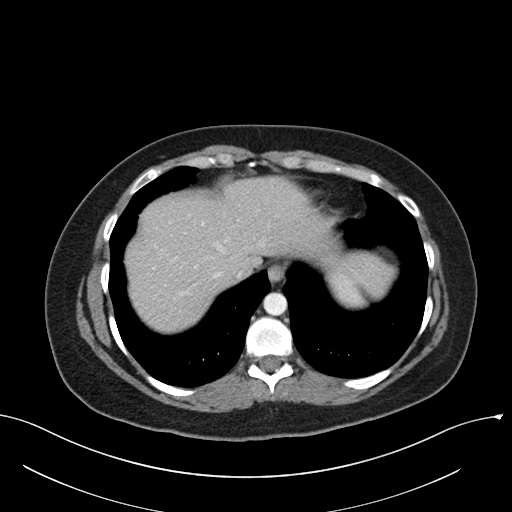
[im 87/93  soft-tissue]
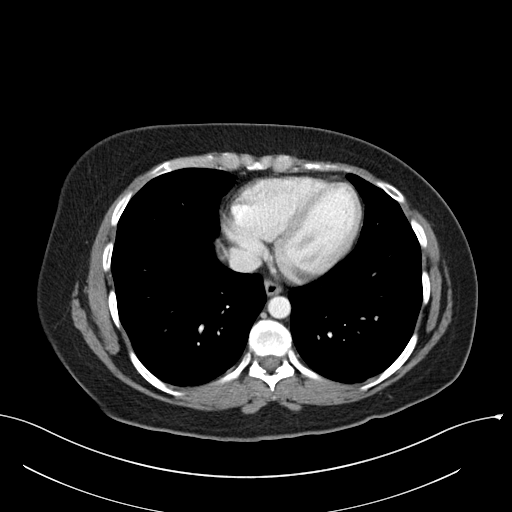

[Series 5: coronal st · coronal · 0.88mm/px · 3 of 89 slices shown]
[im 30/89  soft-tissue]
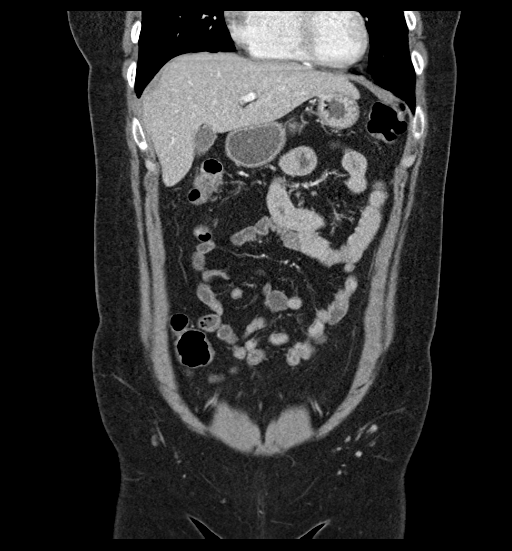
[im 40/89  soft-tissue]
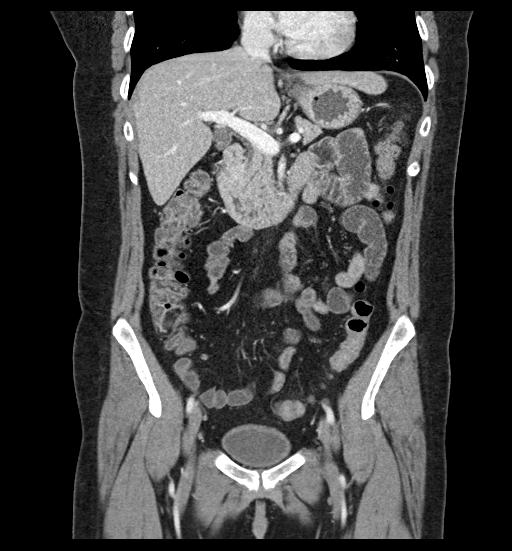
[im 49/89  soft-tissue]
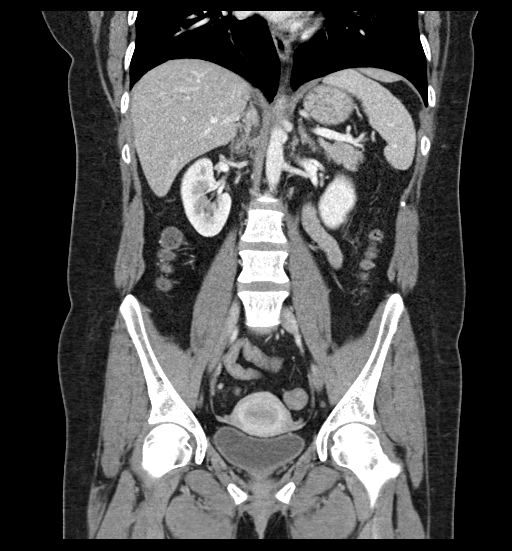

[17 of 46 positions shown; findings below may reference images not displayed]

FINDINGS: Lower chest: Lung bases are clear. No effusions. Heart is normal
size.

Hepatobiliary: No focal hepatic abnormality. Gallbladder
unremarkable.

Pancreas: No focal abnormality or ductal dilatation.

Spleen: No focal abnormality.  Normal size.

Adrenals/Urinary Tract: No adrenal abnormality. No focal renal
abnormality. No stones or hydronephrosis. Urinary bladder is
unremarkable.

Stomach/Bowel: Stomach, large and small bowel grossly unremarkable.
Appendix is normal.

Vascular/Lymphatic: No evidence of aneurysm or adenopathy.

Reproductive: 3.5 cm right ovarian cyst. Uterus and left adnexa
unremarkable.

Other: No free fluid or free air.

Musculoskeletal: No acute bony abnormality.
IMPRESSION: Normal appendix.

Small right ovarian cyst, 3.5 cm.

## 2019-03-10 DIAGNOSIS — G5600 Carpal tunnel syndrome, unspecified upper limb: Secondary | ICD-10-CM | POA: Insufficient documentation

## 2020-03-10 DIAGNOSIS — K219 Gastro-esophageal reflux disease without esophagitis: Secondary | ICD-10-CM | POA: Insufficient documentation

## 2020-05-14 ENCOUNTER — Ambulatory Visit (HOSPITAL_COMMUNITY)
Admission: RE | Admit: 2020-05-14 | Discharge: 2020-05-14 | Disposition: A | Discharge status: Home / Self Care | Payer: 59 | Attending: Psychiatry | Admitting: Psychiatry

## 2020-05-14 DIAGNOSIS — F1920 Other psychoactive substance dependence, uncomplicated: Secondary | ICD-10-CM

## 2020-05-14 NOTE — BH Assessment (Signed)
Comprehensive Clinical Assessment (CCA) Note  05/14/2020 Jessica Marshall [id number]   Patient states that she wants to get off of drugs.  Therefore, she went to the [facility] to get on the Suboxone Program.  However, they stated that she tested positive for too many drugs and that she would have to be detoxed before they could offer her services. Patient's Currently Reported Symptoms/Problems: Patient states that her drug use is affecting her work and her finance and she is tired of being "dope sick." Patient states that she has been abusing cocaine, marijuana, methamphetamine and opioids.    Patient states that she struggles with anxiety, but denies that she is depressed and states that she is not suicidal, homicidal or psychotic.  She states that her sleep is good, but her appetite is poor and she states that she has lost 38 pounds.  She denies any history of abuse or self-mutilation.  Per Psychiatrist MSE/Evaluation:  [age] y old female, presents with her mother. Reports history of substance use disorder and states she has been using opiate analgesics ( percocet, hydrocodone) which she has been crushing and insufflating. Denies IVDA. She also reports recent methamphetamine use . She reports she last used opiates and methamphetamine 1-2 days ago. She reports remote Xanax abuse, but states she has not used BZDs for close to a year. Denies alcohol abuse.  She reports she has been feeling vaguely depressed partly due to drug abuse , but denies having had any suicidal ideations recently, and is future oriented, hoping to work on sobriety and abstinence. She reports that in the past was treated with Zoloft for PTSD and anxiety but has not been taking any prescribed medications recently. Denies history of suicide attempts, denies history of psychosis.  States she has been wanting to stop using illicit drugs and went to a program to initiate Suboxone, but was told she required detox prior to being  evaluated there and treatment initiated . She was seen by TTS provider- [facility] in [place] for detox. Patient states she is motivated in this proposed disposition plan. Her mother is with her and will transport.  (*Patient is here voluntarily/no IVC)   Visit Diagnosis:   F19.20 Polysubstance Use Disorder Severe   CCA Screening, Triage and Referral (STR)  Patient Reported Information How did you hear about us? Other (Comment)  Referral name: [facility]  Referral phone number: No data recorded  Whom do you see for routine medical problems? No data recorded Practice/Facility Name: No data recorded Practice/Facility Phone Number: No data recorded Name of Contact: No data recorded Contact Number: No data recorded Contact Fax Number: No data recorded Prescriber Name: No data recorded Prescriber Address (if known): No data recorded  What Is the Reason for Your Visit/Call Today? Patient states that she needs to be detoxed in order to get on the Suboxone Program at the [facility]  How Long Has This Been Causing You Problems? > than 6 months  What Do You Feel Would Help You the Most Today? Other (Comment) (Detox)   Have You Recently Been in Any Inpatient Treatment (Hospital/Detox/Crisis Center/28-Day Program)? No  Name/Location of Program/Hospital:No data recorded How Long Were You There? No data recorded When Were You Discharged? No data recorded  Have You Ever Received Services From [facility] Before? No data recorded Who Do You See at [facility]? No data recorded  Have You Recently Had Any Thoughts About Hurting Yourself? No  Are You Planning to  Commit Suicide/Marshall Yourself At This time? No   Have you Recently Had Thoughts About Hurting Someone Jessica Marshall? No  Explanation: No data recorded  Have You Used Any Alcohol or Drugs in the Past 24 Hours? No  How Long Ago Did You Use Drugs or Alcohol? No data recorded What Did You Use and How Much?  No data recorded  Do You Currently Have a Therapist/Psychiatrist? No  Name of Therapist/Psychiatrist: No data recorded  Have You Been Recently Discharged From Any Office Practice or Programs? No  Explanation of Discharge From Practice/Program: No data recorded    CCA Screening Triage Referral Assessment Type of Contact: Face-to-Face  Is this Initial or Reassessment? No data recorded Date Telepsych consult ordered in [facility]:  No data recorded Time Telepsych consult ordered in [facility]:  No data recorded  Patient Reported Information Reviewed? Yes  Patient Left Without Being Seen? No data recorded Reason for Not Completing Assessment: No data recorded  Collateral Involvement: Mother was with patient during her assessment:  Jessica Marshall   Does Patient Have a Automotive engineer Guardian? No data recorded Name and Contact of Legal Guardian: No data recorded If Minor and Not Living with Parent(s), Who has Custody? No data recorded Is CPS involved or ever been involved? Never  Is APS involved or ever been involved? Never   Patient Determined To Be At Risk for Marshall To Self or Others Based on Review of Patient Reported Information or Presenting Complaint? No  Method: No data recorded Availability of Means: No data recorded Intent: No data recorded Notification Required: No data recorded Additional Information for Danger to Others Potential: No data recorded Additional Comments for Danger to Others Potential: No data recorded Are There Guns or Other Weapons in Your Home? No data recorded Types of Guns/Weapons: No data recorded Are These Weapons Safely Secured?                            No data recorded Who Could Verify You Are Able To Have These Secured: No data recorded Do You Have any Outstanding Charges, Pending Court Dates, Parole/Probation? No data recorded Contacted To Inform of Risk of Marshall To Self or Others: No data recorded  Location of Assessment: [facility] Assessment  Services   Does Patient Present under Involuntary Commitment? No  IVC Papers Initial File Date: No data recorded  [place] of Residence: [place]   Patient Currently Receiving the Following Services: Not Receiving Services   Determination of Need: No data recorded  Options For Referral: Facility-Based Crisis [facility])     CCA Biopsychosocial  Intake/Chief Complaint:  CCA Intake With Chief Complaint CCA Part Two Date: 05/14/20 CCA Part Two Time: 1855 Chief Complaint/Presenting Problem: Patient states that she wants to get off of drugs.  Therefore, she went to the [facility] to get on the Suboxone Program.  However, they stated that she tested positive for too many drugs and that she would have to be detoxed before they could offer her services. Patient's Currently Reported Symptoms/Problems: Patient states that her drug use is affecting her work and her finance and she is tired of being "dope sick" Individual's Strengths: Patient states that she is a determined person and head strong Individual's Preferences: Patient is requesting to be detoxed from opioids, not other preferences requested Individual's Abilities: Patient states that she is good at her job and sports Type of Services Patient Feels Are Needed: Patient feels like she  needs to be on a maintenance program in order to be successful in staying off drugs  Mental Health Symptoms Depression:  Depression: None  Mania:  Mania: None  Anxiety:   Anxiety: None  Psychosis:  Psychosis: None  Trauma:  Trauma: None  Obsessions:  Obsessions: None  Compulsions:  Compulsions: None  Inattention:  Inattention: None  Hyperactivity/Impulsivity:  Hyperactivity/Impulsivity: N/A  Oppositional/Defiant Behaviors:  Oppositional/Defiant Behaviors: None  Emotional Irregularity:  Emotional Irregularity: None  Other Mood/Personality Symptoms:      Mental Status Exam Appearance and self-care  Stature:  Stature: Small  Weight:   Weight: Thin  Clothing:  Clothing: Neat/clean, Casual  Grooming:  Grooming: Normal  Cosmetic use:  Cosmetic Use: None  Posture/gait:  Posture/Gait: Normal  Motor activity:  Motor Activity: Not Remarkable  Sensorium  Attention:  Attention: Normal  Concentration:  Concentration: Anxiety interferes  Orientation:  Orientation: Object, Person, Place, Situation, Time  Recall/memory:  Recall/Memory: Normal  Affect and Mood  Affect:  Affect: Anxious  Mood:  Mood: Anxious  Relating  Eye contact:  Eye Contact: Normal  Facial expression:  Facial Expression: Anxious  Attitude toward examiner:  Attitude Toward Examiner: Cooperative  Thought and Language  Speech flow: Speech Flow: Normal  Thought content:  Thought Content: Appropriate to Mood and Circumstances  Preoccupation:  Preoccupations: None  Hallucinations:  Hallucinations: None  Organization:     Company secretary of Knowledge:  Fund of Knowledge: Good  Intelligence:  Intelligence: Above Average  Abstraction:  Abstraction: Normal  Judgement:  Judgement: Fair  Dance movement psychotherapist:  Reality Testing: Realistic  Insight:  Insight: Fair  Decision Making:  Decision Making: Impulsive  Social Functioning  Social Maturity:  Social Maturity: Responsible  Social Judgement:  Social Judgement: Normal  Stress  Stressors:  Stressors: Work, Immunologist:  Coping Ability: Normal  Skill Deficits:  Skill Deficits: None  Supports:  Supports: Family     Religion: Religion/Spirituality Are You A Religious Person?:  (not assessed)  Leisure/Recreation: Leisure / Recreation Do You Have Hobbies?: Yes Leisure and Hobbies: sports  Exercise/Diet: Exercise/Diet Do You Exercise?: No Have You Gained or Lost A Significant Amount of Weight in the Past Six Months?: Yes-Lost Number of Pounds Lost?: 38 Do You Follow a Special Diet?: No Do You Have Any Trouble Sleeping?: No   CCA Employment/Education  Employment/Work  Situation: Employment / Work Situation Employment situation: Employed 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 How long has patient been employed?: 7 years Patient's job has been impacted by current illness: Yes Describe how patient's job has been impacted: missing work, not able to focus or concentrate What is the longest time patient has a held a job?: [employer] Where was the patient employed at that time?: [employer] Has patient ever been in the [employer]?: No  Education: Education Is Patient Currently Attending School?: No Last Grade Completed: 12 Name of High School: [employer] [place] Did Garment/textile technologist From [employer]?: Yes Did Theme park manager?: No Did Designer, television/film set?: No Did You Have Any Special Interests In School?: none reported Did You Have An Individualized Education Program (IIEP): No Did You Have Any Difficulty At [employer]?: No Patient's Education Has Been Impacted by Current Illness: No   CCA Family/Childhood History  Family and Relationship History: Family history Are you sexually active?: No What is your sexual orientation?: Patient is a lesbian Has your sexual activity been affected by drugs, alcohol, medication, or emotional stress?: decline in sex drive due  to drug use Does patient have children?: No  Childhood History:  Childhood History By whom was/is the patient raised?: Mother Additional childhood history information: Patient states that her father was never a part of her life.  She states that she is close to her mother now, but for unknown reasons, patient was raised by her grandmother Description of patient's relationship with caregiver when they were a child: Patient states that she had a close relationship with her grandmother Patient's description of current relationship with people who raised him/her: states that she is still very close to her grandmother How were you disciplined when you got in trouble as a child/adolescent?: Patient  states that she was disciplined appropriately and was not abused Does patient have siblings?: Yes Number of Siblings: 2 Description of patient's current relationship with siblings: Patient states that she is close to her siblings Did patient suffer any verbal/emotional/physical/sexual abuse as a child?: No Did patient suffer from severe childhood neglect?: No Has patient ever been sexually abused/assaulted/raped as an adolescent or adult?: No Was the patient ever a victim of a crime or a disaster?: No Witnessed domestic violence?: No Has patient been affected by domestic violence as an adult?: No  Child/Adolescent Assessment:     CCA Substance Use  Alcohol/Drug Use: Alcohol / Drug Use Pain Medications: see MAR Prescriptions: see MAR Over the Counter: see MAR History of alcohol / drug use?: Yes Longest period of sobriety (when/how long): none reported Negative Consequences of Use: Financial, Personal relationships, Work / Automotive engineer #1 Name of Substance 1: opioids 1 - Age of First Use: 17 1 - Amount (size/oz): 8-9 pills 1 - Frequency: daily 1 - Duration: since onset 1 - Last Use / Amount: last Wednesday Substance #2 Name of Substance 2: cocaine/methamphetamine 2 - Age of First Use: 20 2 - Amount (size/oz): $20-100 2 - Frequency: daily 2 - Duration: since onset 2 - Last Use / Amount: yesterday Substance #3 Name of Substance 3: Marijuana 3 - Age of First Use: 16 3 - Amount (size/oz): 1 bowl 3 - Frequency: daily 3 - Duration: since onset 3 - Last Use / Amount: yesterday                   ASAM's:  Six Dimensions of Multidimensional Assessment  Dimension 1:  Acute Intoxication and/or Withdrawal Potential:   Dimension 1:  Description of individual's past and current experiences of substance use and withdrawal: Patient states that she is experiencing opioid withdrawal of nausea, cramps, vomiting  Dimension 2:  Biomedical Conditions and Complications:    Dimension 2:  Description of patient's biomedical conditions and  complications: Patient states that she does not have any medical issues compromised by her use  Dimension 3:  Emotional, Behavioral, or Cognitive Conditions and Complications:  Dimension 3:  Description of emotional, behavioral, or cognitive conditions and complications: Patient states that she uses drugs to self-medicate her anxiety  Dimension 4:  Readiness to Change:  Dimension 4:  Description of Readiness to Change criteria: Patient states that she is ready to make changes in her life and live drug free  Dimension 5:  Relapse, Continued use, or Continued Problem Potential:  Dimension 5:  Relapse, continued use, or continued problem potential critiera description: Patient states that she has been a chronic relpaser and has experienced minimal success in recovery  Dimension 6:  Recovery/Living Environment:  Dimension 6:  Recovery/Iiving environment criteria description: Patient states that she lives in a stable and supportive environment  ASAM Severity Score: ASAM's Severity Rating Score: 7  ASAM Recommended Level of Treatment: ASAM Recommended Level of Treatment: Level III Residential Treatment (detox)   Substance use Disorder (SUD) Substance Use Disorder (SUD)  Checklist Symptoms of Substance Use: Continued use despite having a persistent/recurrent physical/psychological problem caused/exacerbated by use, Continued use despite persistent or recurrent social, interpersonal problems, caused or exacerbated by use, Large amounts of time spent to obtain, use or recover from the substance(s), Recurrent use that results in a failure to fulfill major role obligations (work, school, home), Social, occupational, recreational activities given up or reduced due to use, Persistent desire or unsuccessful efforts to cut down or control use, Evidence of tolerance, Presence of craving or strong urge to use, Substance(s) often taken in larger amounts or  over longer times than was intended  Recommendations for Services/Supports/Treatments: Recommendations for Services/Supports/Treatments Recommendations For Services/Supports/Treatments: Facility Based Crisis (Patient was referred for detox to [facility])  DSM5 Diagnoses: Patient Active Problem List   Diagnosis Date Noted  . 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 Jama Flavors, patient does not meet inpatient admission criteria and was referred to [facility] in [place] for evaluation into their detox program.  Beds were available and mother agreed to transport.   Referrals to Alternative Service(s): Referred to Alternative Service(s):   Place:   Date:   Time:    Referred to Alternative Service(s):   Place:   Date:   Time:    Referred to Alternative Service(s):   Place:   Date:   Time:    Referred to Alternative Service(s):   Place:   Date:   Time:     Arnoldo Lenis Sprinkle

## 2020-05-14 NOTE — H&P (Signed)
Behavioral Health Medical Screening Exam  Jessica Marshall is a 39 y.o. female.  Total Time spent with patient: 6 minutes   35 y old female, presents with her mother. Reports history of substance use disorder and states she has been using opiate analgesics ( percocet, hydrocodone) which she has been crushing and insufflating. Denies IVDA. She also reports recent methamphetamine use . She reports she last used opiates and methamphetamine 1-2 days ago. She reports remote Xanax abuse, but states she has not used BZDs for close to a year. Denies alcohol abuse . She reports she has been feeling vaguely depressed partly due to drug abuse , but denies having had any suicidal ideations recently, and is future oriented, hoping to work on sobriety and abstinence. She reports that in the past was treated with Zoloft for PTSD and anxiety but has not been taking any prescribed medications recently. Denies history of suicide attempts, denies history of psychosis.  States she has been wanting to stop using illicit drugs and went to a program to initiate Suboxone, but was told she required detox prior to being evaluated there and treatment initiated . She was seen by TTS provider- is being referred to [facility] in [place] for detox. Patient states she is motivated in this proposed disposition plan. Her mother is with her and will transport.  (*Patient is here voluntarily/no IVC)    Psychiatric Specialty Exam  Presentation  General Appearance casual Eye Contact: good Speech:normal  Speech Volume Normal  Handedness:Right  Mood and Affect  Mood: reports mild depression/dysphoria but states " I am doing all right" Affect: appropriate, reactive  Thought Process  Thought Processes:linear, intact associations Descriptions of Associations:intact  Orientation:fully alert and attentive x 3  Thought Content:no hallucinations, no delusions  Hallucinations:none and does not appear internally  preoccupied Ideas of Reference:none  Suicidal Thoughts:denies suicidal ideations and presents future oriented Homicidal Thoughts:denies  Sensorium  Memory:grossly intact  Judgment:grossly intact  Insight:present Executive Functions  Concentration:good  Attention Span:good  Recall:grossly intact  Fund of Knowledge:good  Language:good  Psychomotor Activity  Psychomotor Activity: no psychomotor agitation or restlessness at this time, and does not appear to be in any acute distress  Assets  Assets: motivated in treatment, family support  Sleep  Sleep:fair  Number of hours: unspecified Physical Exam: Physical Exam presents alert, attentive, in no acute physical distress, breathing comfortably at room air, vitals are stable- BP 106/86, pulse 99, Pulse ox at room air 100 , Temp 98.2 ROS reports some symptoms of opiate WDL- nausea, cramps, cravings. Reports she vomited x 1 yesterday but not today   Musculoskeletal: Strength & Muscle Tone: within normal limits Gait & Station: normal Patient leans: N/A   Recommendations:  Based on my evaluation the patient does not appear to have an emergency medical condition.  Patient will go to [facility] in [place] for detox/treatment. States that on completion plans to go start Suboxone management.  Craige Cotta, MD 05/14/2020, 6:34 PM

## 2020-05-15 ENCOUNTER — Other Ambulatory Visit: Payer: Self-pay

## 2020-05-15 ENCOUNTER — Encounter (HOSPITAL_COMMUNITY): Payer: Self-pay | Admitting: Emergency Medicine

## 2020-05-15 ENCOUNTER — Emergency Department (HOSPITAL_COMMUNITY)
Admission: EM | Admit: 2020-05-15 | Discharge: 2020-05-15 | Disposition: A | Discharge status: Home / Self Care | Payer: 59 | Attending: Emergency Medicine | Admitting: Emergency Medicine

## 2020-05-15 DIAGNOSIS — R454 Irritability and anger: Secondary | ICD-10-CM | POA: Diagnosis present

## 2020-05-15 DIAGNOSIS — F419 Anxiety disorder, unspecified: Secondary | ICD-10-CM | POA: Diagnosis not present

## 2020-05-15 DIAGNOSIS — F17210 Nicotine dependence, cigarettes, uncomplicated: Secondary | ICD-10-CM | POA: Insufficient documentation

## 2020-05-15 DIAGNOSIS — F1910 Other psychoactive substance abuse, uncomplicated: Secondary | ICD-10-CM | POA: Insufficient documentation

## 2020-05-15 LAB — COMPREHENSIVE METABOLIC PANEL
ALT: 15 U/L (ref 0–44)
AST: 16 U/L (ref 15–41)
Albumin: 4.3 g/dL (ref 3.5–5.0)
Alkaline Phosphatase: 104 U/L (ref 38–126)
Anion gap: 9 (ref 5–15)
BUN: 14 mg/dL (ref 6–20)
CO2: 23 mmol/L (ref 22–32)
Calcium: 9.6 mg/dL (ref 8.9–10.3)
Chloride: 106 mmol/L (ref 98–111)
Creatinine, Ser: 0.99 mg/dL (ref 0.44–1.00)
GFR calc Af Amer: >60 mL/min (ref >60)
GFR calc non Af Amer: >60 mL/min (ref >60)
Glucose, Bld: 83 mg/dL (ref 70–99)
Potassium: 3.9 mmol/L (ref 3.5–5.1)
Sodium: 138 mmol/L (ref 135–145)
Total Bilirubin: 0.5 mg/dL (ref 0.3–1.2)
Total Protein: 7.4 g/dL (ref 6.5–8.1)

## 2020-05-15 LAB — CBC WITH DIFFERENTIAL/PLATELET
Abs Immature Granulocytes: 0.03 10*3/uL (ref 0.00–0.07)
Basophils Absolute: 0.1 10*3/uL (ref 0.0–0.1)
Basophils Relative: 1 %
Eosinophils Absolute: 0 10*3/uL (ref 0.0–0.5)
Eosinophils Relative: 0 %
HCT: 41.2 % (ref 36.0–46.0)
Hemoglobin: 14.5 g/dL (ref 12.0–15.0)
Immature Granulocytes: 0 %
Lymphocytes Relative: 33 %
Lymphs Abs: 2.3 10*3/uL (ref 0.7–4.0)
MCH: 34.3 pg — ABNORMAL HIGH (ref 26.0–34.0)
MCHC: 35.2 g/dL (ref 30.0–36.0)
MCV: 97.4 fL (ref 80.0–100.0)
Monocytes Absolute: 0.5 10*3/uL (ref 0.1–1.0)
Monocytes Relative: 8 %
Neutro Abs: 4.1 10*3/uL (ref 1.7–7.7)
Neutrophils Relative %: 58 %
Platelets: 274 10*3/uL (ref 150–400)
RBC: 4.23 MIL/uL (ref 3.87–5.11)
RDW: 12 % (ref 11.5–15.5)
WBC: 7.1 10*3/uL (ref 4.0–10.5)
nRBC: 0 % (ref 0.0–0.2)

## 2020-05-15 LAB — I-STAT BETA HCG BLOOD, ED (MC, WL, AP ONLY): I-stat hCG, quantitative: <5 m[IU]/mL (ref <5)

## 2020-05-15 LAB — ETHANOL: Alcohol, Ethyl (B): <10 mg/dL (ref <10)

## 2020-05-15 MED ORDER — CLONIDINE HCL 0.1 MG PO TABS
0.2000 mg | ORAL_TABLET | Freq: Once | ORAL | Status: AC
  Administered 2020-05-15: 0.2 mg via ORAL
  Filled 2020-05-15: qty 2

## 2020-05-15 MED ORDER — CLONIDINE HCL 0.2 MG PO TABS: 0.2000 mg | ORAL_TABLET | Freq: Two times a day (BID) | ORAL | 0 refills | Status: DC

## 2020-05-15 NOTE — ED Triage Notes (Signed)
Patient here from home requesting detox from "everything". Opioids, meth, cocaine, etc. Reports that she was referred here by the [facility].

## 2020-05-15 NOTE — ED Provider Notes (Signed)
[facility]-EMERGENCY DEPT Provider Note   CSN: [id number] Arrival date & time: 05/15/20  1437     History Chief Complaint  Patient presents with  . Detox    Jessica Marshall is a 39 y.o. female.  Patient is a 39 year old female has a longstanding history of posttraumatic stress disorder and polysubstance abuse presenting to the emergency department for "I feel like I am going crazy".  Patient reports that she went to an outpatient rehab center today and was told that she needs to go to the emergency department for psych evaluation and detox.  She last used methamphetamine yesterday and a pain pill this morning.  Reports that she does not use benzos or alcohol but she uses cocaine, methamphetamine, opiates and marijuana.  She reports that she is feeling very irritable and very anxious and would like a psychiatric evaluation.  She denies specific SI or HI        Past Medical History:  Diagnosis Date  . Anxiety   . Depression   . PTSD (post-traumatic stress disorder)     Patient Active Problem List   Diagnosis Date Noted  . Polysubstance (including opioids) dependence w/o physiol dependence (HCC)     History reviewed. No pertinent surgical history.   OB History   No obstetric history on file.     No family history on file.  Social History   Tobacco Use  . Smoking status: Current Every Day Smoker    Packs/day: 0.50    Types: Cigarettes  . Smokeless tobacco: Never Used  Substance Use Topics  . Alcohol use: No  . Drug use: No    Home Medications Prior to Admission medications   Medication Sig Start Date End Date Taking? Authorizing Provider  acetaminophen (TYLENOL) 500 MG tablet Take 500 mg by mouth every 6 (six) hours as needed for mild pain.    [provider]  clonazePAM (KLONOPIN) 0.5 MG tablet Take 0.5 mg by mouth 3 (three) times daily as needed for anxiety.     [provider]  cloNIDine (CATAPRES) 0.2 MG tablet Take  1 tablet (0.2 mg total) by mouth 2 (two) times daily for 3 days. 05/15/20 05/18/20  Arlyn Dunning, PA-C  HYDROcodone-acetaminophen (NORCO/VICODIN) 5-325 MG tablet Take 1 tablet by mouth every 6 (six) hours as needed for moderate pain. 12/05/17   Azalia Bilis, MD  ibuprofen (ADVIL,MOTRIN) 600 MG tablet Take 1 tablet (600 mg total) by mouth every 6 (six) hours as needed. Patient taking differently: Take 600 mg by mouth every 6 (six) hours as needed for moderate pain.  06/02/17   Lorre Nick, MD  ondansetron (ZOFRAN ODT) 8 MG disintegrating tablet Take 1 tablet (8 mg total) by mouth every 8 (eight) hours as needed for nausea or vomiting. 12/05/17   Azalia Bilis, MD  sertraline (ZOLOFT) 100 MG tablet Take 100 mg by mouth daily.     [provider]  traZODone (DESYREL) 100 MG tablet Take 100 mg by mouth at bedtime.    [provider]    Allergies    Patient has no known allergies.  Review of Systems   Review of Systems  Constitutional: Negative for chills and fever.  Neurological: Negative for dizziness.  Psychiatric/Behavioral: Positive for behavioral problems and sleep disturbance. Negative for self-injury and suicidal ideas. The patient is nervous/anxious.   All other systems reviewed and are negative.   Physical Exam Updated Vital Signs BP 122/90   Pulse 80   Temp  98.1 F (36.7 C) (Oral)   Resp 16   SpO2 100%   Physical Exam Vitals and nursing note reviewed.  Constitutional:      General: She is not in acute distress.    Appearance: Normal appearance. She is not ill-appearing, toxic-appearing or diaphoretic.  HENT:     Head: Normocephalic.  Eyes:     Conjunctiva/sclera: Conjunctivae normal.  Pulmonary:     Effort: Pulmonary effort is normal.  Skin:    General: Skin is dry.  Neurological:     Mental Status: She is alert.  Psychiatric:        Mood and Affect: Mood is anxious. Affect is tearful.        Thought Content: Thought content does not include  homicidal or suicidal plan.     ED Results / Procedures / Treatments   Labs (all labs ordered are listed, but only abnormal results are displayed) Labs Reviewed  CBC WITH DIFFERENTIAL/PLATELET - Abnormal; Notable for the following components:      Result Value   MCH 34.3 (*)    All other components within normal limits  COMPREHENSIVE METABOLIC PANEL  ETHANOL  RAPID URINE DRUG SCREEN, HOSP PERFORMED  I-STAT BETA HCG BLOOD, ED ([facility], [facility], [facility] ONLY)    EKG None  Radiology No results found.  Procedures Procedures (including critical care time)  Medications Ordered in ED Medications  cloNIDine (CATAPRES) tablet 0.2 mg (0.2 mg Oral Given 05/15/20 1908)    ED Course  I have reviewed the triage vital signs and the nursing notes.  Pertinent labs & imaging results that were available during my care of the patient were reviewed by me and considered in my medical decision making (see chart for details).  Clinical Course as of May 16 2307  Tue May 15, 2020  1555 Patient presents very tearful and anxious reporting need for psych evaluation and detox because she feels that she is going crazy.  She last used 1 pain pill this morning and methamphetamine yesterday.  I advised her that we do not offer detox from drugs here at this facility.  She understands but reports that she would like a psychiatric evaluation because she "just cannot take it anymore". Does not appear to be in any significant w/d at this time.  She had a TTS evaluation yesterday and was referred to [facility] also and supposedly she was told they cannot start suboxone. It sounds like this is probably because she was not in active withdrawal yet and starting would precipitate bad withdrawal. I will start the patient on clonidine today. She was advised on the risks of clonidine including low BP. Advise to monitor this and f/u with [facility] to hopefully start treatment tomorrow. She denies SI or HI   [KM]    Clinical Course User  Index [KM] [name]   MDM Rules/Calculators/A&P                          Based on review of vitals, medical screening exam, lab work and/or imaging, there does not appear to be an acute, emergent etiology for the patient's symptoms. Counseled pt on good return precautions and encouraged both PCP and ED follow-up as needed.  Prior to discharge, I also discussed incidental imaging findings with patient in detail and advised appropriate, recommended follow-up in detail.  Clinical Impression: 1. Anxiety   2. Polysubstance abuse ([place])     Disposition: Discharge  Prior to providing a  prescription for a controlled substance, I independently reviewed the patient's recent prescription history on the [place] Controlled Substance Reporting System. The patient had no recent or regular prescriptions and was deemed appropriate for a brief, less than 3 day prescription of narcotic for acute analgesia.  This note was prepared with assistance of Conservation officer, historic buildings. Occasional wrong-word or sound-a-like substitutions may have occurred due to the inherent limitations of voice recognition software.  Final Clinical Impression(s) / ED Diagnoses Final diagnoses:  Anxiety  Polysubstance abuse (HCC)    Rx / DC Orders ED Discharge Orders         Ordered    cloNIDine (CATAPRES) 0.2 MG tablet  2 times daily     Discontinue  Reprint     05/15/20 1852           Jeral Pinch 05/15/20 2308    Vanetta Mulders, MD 05/15/20 2314

## 2020-05-15 NOTE — Discharge Instructions (Addendum)
You were seen today for drug abuse. Your labs are reassuring. We have prescribed you clonidine which can help with opoid withdrawal symptoms. Please take this as prescribed and follow up with [facility]. Be aware that this medication can cause low blood pressure. If you fee light headed or like you are going to pass out please check your blood pressure and discontinue this medication.

## 2020-05-22 ENCOUNTER — Ambulatory Visit (HOSPITAL_COMMUNITY)
Admission: EM | Admit: 2020-05-22 | Discharge: 2020-05-22 | Disposition: A | Discharge status: Home / Self Care | Payer: 59

## 2020-05-22 ENCOUNTER — Other Ambulatory Visit: Payer: Self-pay

## 2020-05-22 DIAGNOSIS — F1910 Other psychoactive substance abuse, uncomplicated: Secondary | ICD-10-CM | POA: Diagnosis not present

## 2020-05-22 NOTE — ED Notes (Signed)
No belongings to log for patient.  Patient gave phone and DL to grandmother in lobby prior to entering [facility] for metal detector screening.

## 2020-05-22 NOTE — Discharge Instructions (Signed)
You have been accepted to [facility] for detox with a plan to enter their 21 day treatment program once detox is complete.  [facility]  [phone] [address] [place], [place] [address]   Your admission is scheduled for 1:30PM today.

## 2020-05-22 NOTE — BH Assessment (Signed)
Comprehensive Clinical Assessment (CCA) Screening, Triage and Referral Note  05/22/2020 Jessica Marshall [id number]   Patient is a 39 year old female who presents to [facility] requesting detox from multiple substances.  She was seen on 6/21 and referred to [facility] for detox.  She states she only stayed 5 hours as they weren't providing treatment and recommended outpatient treatment.  Patient states before she considers outpatient treatment, she needs detox and is interested in residential treatment before returning to work.  She is currently on leave, since her supervisor heard about her court date for a DWI.  Her position is secure for the time being, as she seeks treatment. Patient reports she has a long history of using THC, cocaine, methamphetamines and opiates.  She last used 1/2 percocet tablet last night.  She has been using meth every weekend and cocaine several times per week.  Patient states she has detoxed on her own in the past, however she states this was just detox from cocaine.  She doesn't feel she can manage detox from opiates, along with other substances, on her own.  She is requesting referral to detox/residential programs.  Patient denies SI, HI and AVH or any related history.  She is able to affirm her safety in SA treatment programs if accepted.    LPC contacted [facility] to pursue a detox bed.  Patient has been accepted to [facility] for detox with admission time today at 1:30.  Her grandmother will transport her.   Per [name], patient does not meet inpatient criteria.  She is in agreement with disposition plan of admission to [facility] for detox.    Visit Diagnosis: No diagnosis found.  Patient Reported Information How did you hear about [place]? Self   Referral name: [facility]   Referral phone number: No data recorded Whom do you see for routine medical problems? I don't have a doctor   Practice/Facility Name: No data recorded  Practice/Facility  Phone Number: No data recorded  Name of Contact: No data recorded  Contact Number: No data recorded  Contact Fax Number: No data recorded  Prescriber Name: No data recorded  Prescriber Address (if known): No data recorded What Is the Reason for Your Visit/Call Today? Patient states she needs detox/treatment.  How Long Has This Been Causing You Problems? 1 wk - 1 month  Have You Recently Been in Any Inpatient Treatment (Hospital/Detox/Crisis Center/28-Day Program)? No   Name/Location of Program/Hospital:No data recorded  How Long Were You There? No data recorded  When Were You Discharged? No data recorded Have You Ever Received Services From [facility] Before? No   Who Do You See at [facility]? No data recorded Have You Recently Had Any Thoughts About Hurting Yourself? No   Are You Planning to Commit Suicide/Harm Yourself At This time?  No  Have you Recently Had Thoughts About Hurting Someone [name]? No   Explanation: No data recorded Have You Used Any Alcohol or Drugs in the Past 24 Hours? No   How Long Ago Did You Use Drugs or Alcohol?  No data recorded  What Did You Use and How Much? No data recorded What Do You Feel Would Help You the Most Today? Therapy;Medication  Do You Currently Have a Therapist/Psychiatrist? No   Name of Therapist/Psychiatrist: No data recorded  Have You Been Recently Discharged From Any Office Practice or Programs? No   Explanation of Discharge From Practice/Program:  No data recorded    CCA Screening  Triage Referral Assessment Type of Contact: Face-to-Face   Is this Initial or Reassessment? No data recorded  Date Telepsych consult ordered in [facility]:  No data recorded  Time Telepsych consult ordered in [facility]:  No data recorded Patient Reported Information Reviewed? Yes   Patient Left Without Being Seen? No data recorded  Reason for Not Completing Assessment: No data recorded Collateral Involvement: N/A  Does Patient Have a Court Appointed Legal  Guardian? No data recorded  Name and Contact of Legal Guardian:  No data recorded If Minor and Not Living with Parent(s), Who has Custody? No data recorded Is CPS involved or ever been involved? Never  Is APS involved or ever been involved? Never  Patient Determined To Be At Risk for Harm To Self or Others Based on Review of Patient Reported Information or Presenting Complaint? No   Method: No data recorded  Availability of Means: No data recorded  Intent: No data recorded  Notification Required: No data recorded  Additional Information for Danger to Others Potential:  No data recorded  Additional Comments for Danger to Others Potential:  No data recorded  Are There Guns or Other Weapons in Your Home?  No data recorded   Types of Guns/Weapons: No data recorded   Are These Weapons Safely Secured?                              No data recorded   Who Could Verify You Are Able To Have These Secured:    No data recorded Do You Have any Outstanding Charges, Pending Court Dates, Parole/Probation? No data recorded Contacted To Inform of Risk of Harm To Self or Others: No data recorded Location of Assessment: [facility]  Does Patient Present under Involuntary Commitment? No   IVC Papers Initial File Date: No data recorded  [place] of Residence: [place]  Patient Currently Receiving the Following Services: Not Receiving Services   Determination of Need: Urgent (48 hours)   Options For Referral: Facility-Based Crisis [facility])   Yetta Glassman

## 2020-05-22 NOTE — ED Provider Notes (Signed)
Behavioral Health Medical Screening Exam  Jessica Marshall is a 39 y.o. female with polysubstance abuse. She has been accepted to [facility] for ongoing detox and residential treatement. She has been advised to report straight to facility. See Counselor CCA for HPI.   Total Time spent with patient: 15 minutes  Psychiatric Specialty Exam  Presentation  General Appearance:Appropriate for Environment  Eye Contact:Good  Speech:Clear and Coherent;Normal Rate  Speech Volume:Normal  Handedness:Right   Mood and Affect  Mood:Depressed  Affect:Appropriate;Congruent   Thought Process  Thought Processes:Coherent;Goal Directed  Descriptions of Associations:Intact  Orientation:Full (Time, Place and Person)  Thought Content:Logical  Hallucinations:None  Ideas of Reference:None  Suicidal Thoughts:No  Homicidal Thoughts:No   Sensorium  Memory:Immediate Fair;Recent Fair;Remote Fair  Judgment:Fair  Insight:Fair   Executive Functions  Concentration:Good  Attention Span:Fair  Recall:Fair  Fund of Knowledge:Fair  Language:Fair   Psychomotor Activity  Psychomotor Activity:Normal   Assets  Assets:Housing;Financial Resources/Insurance   Sleep  Sleep:Fair  Number of hours: No data recorded  Physical Exam: Physical Exam ROS Blood pressure 99/67, pulse 82, temperature (!) 97.5 F (36.4 C), temperature source Oral, resp. rate 16, height 5\' 7"  (1.702 m), weight 142 lb (64.4 kg), SpO2 100 %. Body mass index is 22.24 kg/m.  Musculoskeletal: Strength & Muscle Tone: within normal limits Gait & Station: normal Patient leans: N/A   Recommendations:  Based on my evaluation the patient does not appear to have an emergency medical condition. Will psych clear. Will discharge straight to [facility] with arrival at 130.   , FNP 05/22/2020, 11:28 AM

## 2020-10-23 ENCOUNTER — Ambulatory Visit (HOSPITAL_COMMUNITY): Payer: No Payment, Other | Admitting: Psychiatry

## 2021-01-19 ENCOUNTER — Other Ambulatory Visit: Payer: Self-pay

## 2021-01-19 ENCOUNTER — Encounter (HOSPITAL_COMMUNITY): Payer: Self-pay | Admitting: Emergency Medicine

## 2021-01-19 ENCOUNTER — Emergency Department (HOSPITAL_COMMUNITY)
Admission: EM | Admit: 2021-01-19 | Discharge: 2021-01-19 | Disposition: A | Discharge status: Home / Self Care | Payer: No Typology Code available for payment source | Attending: Emergency Medicine | Admitting: Emergency Medicine

## 2021-01-19 DIAGNOSIS — F17210 Nicotine dependence, cigarettes, uncomplicated: Secondary | ICD-10-CM | POA: Insufficient documentation

## 2021-01-19 DIAGNOSIS — S46912A Strain of unspecified muscle, fascia and tendon at shoulder and upper arm level, left arm, initial encounter: Secondary | ICD-10-CM | POA: Diagnosis not present

## 2021-01-19 DIAGNOSIS — Y92410 Unspecified street and highway as the place of occurrence of the external cause: Secondary | ICD-10-CM | POA: Diagnosis not present

## 2021-01-19 DIAGNOSIS — S4992XA Unspecified injury of left shoulder and upper arm, initial encounter: Secondary | ICD-10-CM | POA: Diagnosis present

## 2021-01-19 DIAGNOSIS — T148XXA Other injury of unspecified body region, initial encounter: Secondary | ICD-10-CM

## 2021-01-19 MED ORDER — KETOROLAC TROMETHAMINE 60 MG/2ML IM SOLN
15.0000 mg | Freq: Once | INTRAMUSCULAR | Status: AC
  Administered 2021-01-19: 15 mg via INTRAMUSCULAR
  Filled 2021-01-19: qty 2
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
## 2022-04-24 ENCOUNTER — Encounter: Payer: Self-pay | Admitting: Emergency Medicine

## 2022-04-24 ENCOUNTER — Ambulatory Visit: Payer: Medicaid Other | Admitting: Emergency Medicine

## 2022-12-26 ENCOUNTER — Ambulatory Visit
Admission: EM | Admit: 2022-12-26 | Discharge: 2022-12-26 | Disposition: A | Discharge status: Home / Self Care | Payer: Medicaid Other | Attending: Physician Assistant | Admitting: Physician Assistant

## 2022-12-26 DIAGNOSIS — K0889 Other specified disorders of teeth and supporting structures: Secondary | ICD-10-CM | POA: Diagnosis not present

## 2022-12-26 DIAGNOSIS — M25532 Pain in left wrist: Secondary | ICD-10-CM

## 2022-12-26 MED ORDER — AMOXICILLIN 500 MG PO CAPS: 500.0000 mg | ORAL_CAPSULE | Freq: Three times a day (TID) | ORAL | 0 refills | Status: DC

## 2022-12-26 MED ORDER — PREDNISONE 20 MG PO TABS: 40.0000 mg | ORAL_TABLET | Freq: Every day | ORAL | 0 refills | Status: AC

## 2022-12-26 NOTE — ED Triage Notes (Signed)
Patient presents to UC for left carpel tunnel flare-up and left lower dental pain x 1 week. Intermittent HA x 6 months. Intermittent bilateral ear burning sensation  x 2 months. Intermittent dizziness x 2 years. Reports panic attack x 1 week. States she takes clonopin, lexapro, and trazodone. States she has not taken these medications since she is out and does not have a PCP. Took ibuprofen this morning about 0800.

## 2022-12-26 NOTE — ED Provider Notes (Signed)
[facility]    CSN: [id number] Arrival date & time: 12/26/22  5809      History   Chief Complaint Chief Complaint  Patient presents with   Wrist Pain   Panic Attack    HPI Jessica Marshall is a 42 y.o. female.   Patient here today for evaluation of multiple complaints.  She states she has known carpal tunnel. She reports that she has been having more pain,  numbness and tingling to her left wrist and hand.  She notes that she feels as if her arm wants to jerk at times.    She also reports some left lower dental pain that is been ongoing for a week.  She states that she did have dental work done to the right side a few weeks ago which was somewhat helpful.  She does not report any fever.    She reports several other complaints including 6 month history of headache, bilateral ear burning for 2 months, dizziness intermittently for 2 years, and panic attacks. She reports she does not currently have PCP and has not been taking Klonopin, lexapro and trazodone. She did taking ibuprofen this morning without resolution of symptoms.  The history is provided by the patient.    Past Medical History:  Diagnosis Date   Anxiety    Depression    PTSD (post-traumatic stress disorder)     Patient Active Problem List   Diagnosis Date Noted   Polysubstance abuse ([place]) 05/22/2020   Polysubstance (including opioids) dependence w/o physiol dependence ([place])     No past surgical history on file.  OB History   No obstetric history on file.      Home Medications    Prior to Admission medications   Medication Sig Start Date End Date Taking? Authorizing Provider  amoxicillin (AMOXIL) 500 MG capsule Take 1 capsule (500 mg total) by mouth 3 (three) times daily. 12/26/22  Yes Francene Finders, PA-C  predniSONE (DELTASONE) 20 MG tablet Take 2 tablets (40 mg total) by mouth daily with breakfast for 5 days. 12/26/22 12/31/22 Yes Francene Finders, PA-C  acetaminophen (TYLENOL) 500 MG tablet  Take 500 mg by mouth every 6 (six) hours as needed for mild pain.    [provider]  clonazePAM (KLONOPIN) 0.5 MG tablet Take 0.5 mg by mouth 3 (three) times daily as needed for anxiety.     [provider]  cloNIDine (CATAPRES) 0.2 MG tablet Take 1 tablet (0.2 mg total) by mouth 2 (two) times daily for 3 days. 05/15/20 05/18/20  Alveria Apley, PA-C  HYDROcodone-acetaminophen (NORCO/VICODIN) 5-325 MG tablet Take 1 tablet by mouth every 6 (six) hours as needed for moderate pain. 12/05/17   Jola Schmidt, MD  ibuprofen (ADVIL,MOTRIN) 600 MG tablet Take 1 tablet (600 mg total) by mouth every 6 (six) hours as needed. Patient taking differently: Take 600 mg by mouth every 6 (six) hours as needed for moderate pain.  06/02/17   Lacretia Leigh, MD  ondansetron (ZOFRAN ODT) 8 MG disintegrating tablet Take 1 tablet (8 mg total) by mouth every 8 (eight) hours as needed for nausea or vomiting. 12/05/17   Jola Schmidt, MD  sertraline (ZOLOFT) 100 MG tablet Take 100 mg by mouth daily.     [provider]  traZODone (DESYREL) 100 MG tablet Take 100 mg by mouth at bedtime.    [provider]    Family History No family history on file.  Social History Social History  Tobacco Use   Smoking status: Every Day    Packs/day: 0.50    Types: Cigarettes   Smokeless tobacco: Never  Substance Use Topics   Alcohol use: No   Drug use: No     Allergies   Patient has no known allergies.   Review of Systems Review of Systems  Constitutional:  Negative for chills and fever.  HENT:  Positive for dental problem and ear pain. Negative for congestion.   Eyes:  Negative for discharge and redness.  Respiratory:  Negative for shortness of breath.   Gastrointestinal:  Negative for abdominal pain, nausea and vomiting.  Neurological:  Positive for dizziness and headaches.  Psychiatric/Behavioral:  The patient is nervous/anxious.      Physical Exam Triage Vital Signs ED Triage  Vitals  Enc Vitals Group     BP      Pulse      Resp      Temp      Temp src      SpO2      Weight      Height      Head Circumference      Peak Flow      Pain Score      Pain Loc      Pain Edu?      Excl. in [place]?    No data found.  Updated Vital Signs BP 131/89 (BP Location: Left Arm)   Pulse 85   Temp 97.8 F (36.6 C) (Oral)   Resp 16   LMP 12/09/2022   SpO2 99%   Visual Acuity Right Eye Distance:   Left Eye Distance:   Bilateral Distance:    Right Eye Near:   Left Eye Near:    Bilateral Near:     Physical Exam Vitals and nursing note reviewed.  Constitutional:      General: She is not in acute distress.    Appearance: Normal appearance. She is not ill-appearing.  HENT:     Head: Normocephalic and atraumatic.     Right Ear: Tympanic membrane normal.     Left Ear: Tympanic membrane normal.     Nose: Nose normal. No congestion or rhinorrhea.     Mouth/Throat:     Comments: Multiple missing/ broken teeth Eyes:     Conjunctiva/sclera: Conjunctivae normal.  Cardiovascular:     Rate and Rhythm: Normal rate.  Pulmonary:     Effort: Pulmonary effort is normal. No respiratory distress.  Musculoskeletal:     Comments: ? Positive phalen's test- when asked to describe her hand sensation she reports that she feels as if her arm is going to "jerk"  Neurological:     Mental Status: She is alert.  Psychiatric:        Mood and Affect: Mood normal.        Behavior: Behavior normal.        Thought Content: Thought content normal.      UC Treatments / Results  Labs (all labs ordered are listed, but only abnormal results are displayed) Labs Reviewed - No data to display  EKG   Radiology No results found.  Procedures Procedures (including critical care time)  Medications Ordered in UC Medications - No data to display  Initial Impression / Assessment and Plan / UC Course  I have reviewed the triage vital signs and the nursing notes.  Pertinent labs &  imaging results that were available during my care of the patient were reviewed by me and considered  in my medical decision making (see chart for details).    Steroid burst and amoxicillin prescribed to cover possible carpal tunnel flare and possible dental infection. Discussed that other more chronic issues would need to be addressed by primary care vs specialist and offered to make appointment to establish care for patient but she declines and states she has somewhere she has been seen in the past and plans to return to that office. Encouraged her to do so in the near future. She does have access to online psych provider who she expects a call from today. Discussed resource of behavioral health urgent care if needed, patient is familiar with same. Recommended follow up with any further concerns.   Final Clinical Impressions(s) / UC Diagnoses   Final diagnoses:  Left wrist pain  Pain, dental   Discharge Instructions   None    ED Prescriptions     Medication Sig Dispense Auth. Provider   predniSONE (DELTASONE) 20 MG tablet Take 2 tablets (40 mg total) by mouth daily with breakfast for 5 days. 10 tablet Ewell Poe F, PA-C   amoxicillin (AMOXIL) 500 MG capsule Take 1 capsule (500 mg total) by mouth 3 (three) times daily. 21 capsule Francene Finders, PA-C      PDMP not reviewed this encounter.   Francene Finders, PA-C 12/26/22 1121

## 2023-03-04 ENCOUNTER — Telehealth: Payer: BLUE CROSS/BLUE SHIELD

## 2023-03-04 ENCOUNTER — Encounter: Payer: Self-pay | Admitting: Nurse Practitioner

## 2023-03-04 ENCOUNTER — Telehealth: Payer: BLUE CROSS/BLUE SHIELD | Admitting: Physician Assistant

## 2023-03-04 DIAGNOSIS — K0889 Other specified disorders of teeth and supporting structures: Secondary | ICD-10-CM

## 2023-03-04 DIAGNOSIS — K219 Gastro-esophageal reflux disease without esophagitis: Secondary | ICD-10-CM

## 2023-03-04 DIAGNOSIS — F419 Anxiety disorder, unspecified: Secondary | ICD-10-CM

## 2023-03-04 NOTE — Progress Notes (Signed)
   Thank you for the details you included in the comment boxes. Those details are very helpful in determining the best course of treatment for you and help [place] to provide the best care. Because you are having a multiple issues, we recommend that you convert this visit to a video visit in order for the provider to better assess what is going on.  The provider will be able to give you a more accurate diagnosis and treatment plan if we can more freely discuss your symptoms and with the addition of a virtual examination.   If you convert to a video visit, we will bill your insurance (similar to an office visit) and you will not be charged for this e-Visit. You will be able to stay at home and speak with the first available [facility] advanced practice provider. The link to do a video visit is in the drop down Menu tab of your Welcome screen in MyChart. You will need to schedule the video visit. You can do this through one of two ways:  1) Go into your MyChart App and select the "Menu" button, then select the "Virtual Urgent Care Visit" then proceed scheduling -OR- 2) Go to [email or web address] and select "Get Started" under the Virtual Urgent Care option, select "View all options", then select the "Schedule on your Time" and proceed with scheduling.   I have spent 5 minutes in review of e-visit questionnaire, review and updating patient chart, medical decision making and response to patient.   Margaretann Loveless, PA-C

## 2023-03-07 ENCOUNTER — Telehealth: Payer: BLUE CROSS/BLUE SHIELD | Admitting: Nurse Practitioner

## 2023-03-07 DIAGNOSIS — K0889 Other specified disorders of teeth and supporting structures: Secondary | ICD-10-CM

## 2023-03-07 NOTE — Progress Notes (Signed)
Based on what you shared with me it looks like you have dental pain,that should be evaluated in a face to face office visit. We cannot do pain medication in and evisit or video visit  NOTE: There will be NO CHARGE for this eVisit   If you are having a true medical emergency please call 911.      For an urgent face to face visit, [place] has six urgent care centers for your convenience:     [facility] at [facility] Directions [phone] [address] [place], [place] [address]    [facility]) Get Driving Directions [phone] [address] [place], [place] [address]  [facility]) Get Driving Directions [phone] [address] [place],  [place]  [address]  [facility] Urgent Care at [facility] Get Driving Directions [phone] [address] [place], [place] [address]   [facility] Urgent Care at [facility] Get Driving Directions  [phone] [address] [place], [place] [address]   [facility] Urgent Care at [facility] Directions [phone] [address] [place], [place] [address]  Your MyChart E-visit questionnaire answers were reviewed by a board certified advanced clinical practitioner to complete your personal care plan based on your specific symptoms.  Thank you for using e-Visits.

## 2023-03-13 ENCOUNTER — Ambulatory Visit: Payer: BLUE CROSS/BLUE SHIELD | Admitting: Family Medicine

## 2023-04-17 ENCOUNTER — Encounter (INDEPENDENT_AMBULATORY_CARE_PROVIDER_SITE_OTHER): Payer: Self-pay

## 2023-07-31 ENCOUNTER — Ambulatory Visit (HOSPITAL_COMMUNITY)
Admission: EM | Admit: 2023-07-31 | Discharge: 2023-07-31 | Disposition: A | Discharge status: Home / Self Care | Payer: BLUE CROSS/BLUE SHIELD

## 2023-07-31 DIAGNOSIS — F4323 Adjustment disorder with mixed anxiety and depressed mood: Secondary | ICD-10-CM

## 2023-07-31 DIAGNOSIS — Z789 Other specified health status: Secondary | ICD-10-CM

## 2023-07-31 NOTE — ED Provider Notes (Signed)
Behavioral Health Urgent Care Medical Screening Exam  Patient Name: Jessica Marshall MRN: [id number] Date of Evaluation: 07/31/23 Chief Complaint:   Diagnosis:  Final diagnoses:  Adjustment disorder with mixed anxiety and depressed mood  Needs assistance with community resources    History of Present illness: Jessica Marshall is a 42 y.o. female. Patient presents voluntarily to [facility] behavioral health for walk-in assessment.  Patient is accompanied by her grandmother, Amera Torno. Patient prefers that her grandmother remain present during assessment.    Macil is assessed, face-to-face, by nurse practitioner. She is seated in assessment area, no acute distress.  She is alert and oriented, pleasant and cooperative during assessment. Patient  presents with euthymic mood, congruent affect.   Patient seeking outpatient psychiatry follow-up today.  She reports history including PTSD, generalized anxiety disorder, major depressive disorder, panic disorder and substance use disorder, primarily opiate use.  Most recently followed at [facility] in 2021.  She has not had access to medications in 3 years.  Previous medications include Lexapro, clonazepam, hydroxyzine and trazodone.  She denies history of inpatient psychiatric hospitalization.  Family mental health and addiction history includes patient's mother diagnosed with substance use disorder.  She reports intermittent symptoms ongoing times more than 3 years including flashbacks, excessive worry, increased anger and irritability, and decreased sleep.  Jessica Marshall encouraged by her probation officer and current mental health court representative to seek evaluation and outpatient psychiatry follow-up.  Patient endorses history of polysubstance use disorder, primarily opioid use.  Denies alcohol and substance use for the past 41 days.  Patient began Suboxone therapy at [facility] 41 days ago.  Patient denies suicidal and homicidal  ideations. Denies history of suicide attempts, denies history of nonsuicidal self-harm.  Patient easily  contracts verbally for safety with this Clinical research associate.    Patient has normal speech and behavior.  She  denies auditory and visual hallucinations.  Patient is able to converse coherently with goal-directed thoughts and no distractibility or preoccupation.  Denies symptoms of paranoia.  Objectively there is no evidence of psychosis/mania or delusional thinking.  [name] resides in [place] with her grandparents.  She denies access to weapons.  She is currently seeking employment, unemployed at this time.  Recently she has been placed in jail and she is waiting for court to be completed prior to seeking new employment.  Patient endorses average appetite.  Chart reviewed and patient discussed with Dr. Nelly Rout on 07/31/2023.  Patient offered support and encouragement.  She verbalizes understanding of treatment plan to include outpatient psychiatry follow-up.  Patient's grandmother, Jessica Marshall, denies safety concerns.   Patient and family are educated and verbalize understanding of mental health resources and other crisis services in the community. They are instructed to call 911 and present to the nearest emergency room should patient experience any suicidal/homicidal ideation, auditory/visual/hallucinations, or detrimental worsening of mental health condition.      Flowsheet Row ED from 07/31/2023 in [facility] ED from 12/26/2022 in [facility] ED from 01/19/2021 in [facility] Emergency Department at [facility]  C-SSRS RISK CATEGORY No Risk No Risk No Risk       Psychiatric Specialty Exam  Presentation  General Appearance:Appropriate for Environment; Casual  Eye Contact:Good  Speech:Clear and Coherent; Normal Rate  Speech Volume:Normal  Handedness:Right   Mood and Affect   Mood: Euthymic  Affect: Appropriate; Congruent   Thought Process  Thought Processes: Coherent; Goal Directed; Linear  Descriptions  of Associations:Intact  Orientation:Full (Time, Place and Person)  Thought Content:Logical; WDL    Hallucinations:None  Ideas of Reference:None  Suicidal Thoughts:No  Homicidal Thoughts:No   Sensorium  Memory: Immediate Good; Recent Fair  Judgment: Fair  Insight: Fair   Executive Functions  Concentration: Good  Attention Span: Good  Recall: Good  Fund of Knowledge: Good  Language: Good   Psychomotor Activity  Psychomotor Activity: Normal   Assets  Assets: Communication Skills; Desire for Improvement; Housing; Health and safety inspector; Intimacy; Physical Health; Resilience; Social Support   Sleep  Sleep: Fair  Number of hours:  7   Physical Exam: Physical Exam Vitals and nursing note reviewed.  Constitutional:      Appearance: Normal appearance. She is well-developed.  HENT:     Head: Normocephalic and atraumatic.     Nose: Nose normal.  Cardiovascular:     Rate and Rhythm: Normal rate.  Pulmonary:     Effort: Pulmonary effort is normal.  Musculoskeletal:        General: Normal range of motion.     Cervical back: Normal range of motion.  Neurological:     Mental Status: She is alert and oriented to person, place, and time.  Psychiatric:        Attention and Perception: Attention and perception normal.        Mood and Affect: Mood and affect normal.        Speech: Speech normal.        Behavior: Behavior normal. Behavior is cooperative.        Thought Content: Thought content normal.        Cognition and Memory: Cognition and memory normal.    Review of Systems  Constitutional: Negative.   HENT: Negative.    Eyes: Negative.   Respiratory: Negative.    Cardiovascular: Negative.   Gastrointestinal: Negative.   Genitourinary: Negative.   Musculoskeletal: Negative.   Skin: Negative.    Neurological: Negative.   Psychiatric/Behavioral: Negative.     Blood pressure (!) 148/88, pulse 89, temperature 98.6 F (37 C), temperature source Oral, resp. rate 19, SpO2 100%. There is no height or weight on file to calculate BMI.  Musculoskeletal: Strength & Muscle Tone: within normal limits Gait & Station: normal Patient leans: N/A   [facility] MSE Discharge Disposition for Follow up and Recommendations: Based on my evaluation the patient does not appear to have an emergency medical condition and can be discharged with resources and follow up care in outpatient services for Medication Management, Substance Abuse Intensive Outpatient Program, and Individual Therapy Follow-up with outpatient psychiatry, resources provided.  Follow-up with current prescriber at [facility].  Continue current medications.  Lenard Lance, FNP 07/31/2023, 9:59 AM

## 2023-07-31 NOTE — Progress Notes (Signed)
   07/31/23 0906  [facility] Triage Screening (Walk-ins at [facility] only)  How Did You Hear About [place]? Family/Friend  What Is the Reason for Your Visit/Call Today? Pt presents to [facility] accompanied by her grandmother. Pt states, "my anger is bad and I am here to be re-evaulted for my mental health". Pt reports that she has been diagnosed with Panic Disorder, PTSD, Depression and Anxiety. Pt is requesting to be put back on her medication. Pt reports she has taken multiple anti-depressants in the past. Pt reports daily racing thoughts, flashbacks, increased panic attacks, moodswings and irritability. Pt reports a hx of weed and meth, but is currently no longer using. Pt denies substance use, SI, HI and AVH at this time. Pt is routine.  How Long Has This Been Causing You Problems? > than 6 months  Have You Recently Had Any Thoughts About Hurting Yourself? No  Are You Planning to Commit Suicide/Harm Yourself At This time? No  Have you Recently Had Thoughts About Hurting Someone [name]? No  Are You Planning To Harm Someone At This Time? No  Are you currently experiencing any auditory, visual or other hallucinations? No  Have You Used Any Alcohol or Drugs in the Past 24 Hours? No  Do you have any current medical co-morbidities that require immediate attention? No  Clinician description of patient physical appearance/behavior: coherent, cooperative  What Do You Feel Would Help You the Most Today? Stress Management;Medication(s)  If access to [facility] was not available, would you have sought care in the Emergency Department? No  Determination of Need Routine (7 days)  Options For Referral Medication Management

## 2023-07-31 NOTE — Discharge Instructions (Addendum)
Patient is instructed prior to discharge to:  Take all medications as prescribed by his/her mental healthcare provider. Report any adverse effects and or reactions from the medicines to his/her outpatient provider promptly. Keep all scheduled appointments, to ensure that you are getting refills on time and to avoid any interruption in your medication.  If you are unable to keep an appointment call to reschedule.  Be sure to follow-up with resources and follow-up appointments provided.  Patient has been instructed & cautioned: To not engage in alcohol and or illegal drug use while on prescription medicines. In the event of worsening symptoms, patient is instructed to call the crisis hotline, 911 and or go to the nearest ED for appropriate evaluation and treatment of symptoms. To follow-up with his/her primary care provider for your other medical issues, concerns and or health care needs.  Information: -National Suicide Prevention Lifeline 1-800-SUICIDE or 1-800-273-8255.  -988 offers 24/7 access to trained crisis counselors who can help people experiencing mental health-related distress. People can call or text 988 or chat 988lifeline.org for themselves or if they are worried about a loved one who may need crisis support.     Outpatient Services for Therapy and Medication Management for Medicaid  [facility] [address] [place], [place], [address] [phone] phone  New Patient Assessment/Therapy Walk-ins Monday and Wednesday: 8am until slots are full. Every 1st and 2nd Friday: 1pm - 5pm  NO ASSESSMENT/THERAPY WALK-INS ON TUESDAYS OR THURSDAYS  New Patient Psychiatry/Medication Management Walk-ins Monday-Friday: 8am-11am  For all walk-ins, we ask that you arrive by 7:30am because patient will be seen in the order of arrival.  Availability is limited; therefore, you may not be seen on the same day that you walk-in.  Our goal is to serve and meet the needs of our  community to the best of our ability.   [facility] [address] [place], [place], [address] [phone] phone  [facility] [address] [place], [place], [address] [phone] phone (Aetna, [employer] [place], BCBS, Cigna, [employer], [employer] Plans, [employer] [facility], BCBS Healthy Blue, Humana, [employer], Medcost, Medicare, Medicaid, Optum, Tricare, UHC, UHC Community Plan, Wellcare)  [facility] [address] [place], [place], [address] [phone] phone  [facility] [address] [place], [place], [address] [phone] phone  [facility] [address] [place], [place], [address] [phone] phone  [facility] [address] [place], [place], [address] [phone] phone  [facility] [address] [place], [place], [address] [phone] phone  [facility] [address] [place], [place], [address] [phone] phone [phone] fax  [facility] [address] [place], [place], [address] [phone] phone [phone] fax  [facility] [address] [place], [place], [address] [phone] phone  [facility] [address] [place], [place], [address]  [phone] phone  [facility]  (Adults Only) [address] [place], [place], [address]  [phone] phone [phone] fax  

## 2023-08-17 ENCOUNTER — Ambulatory Visit (HOSPITAL_COMMUNITY): Payer: BLUE CROSS/BLUE SHIELD | Admitting: Psychiatry

## 2023-08-18 ENCOUNTER — Ambulatory Visit (HOSPITAL_COMMUNITY): Payer: MEDICAID | Admitting: Student

## 2023-09-17 NOTE — ED Notes (Signed)
Called for room no answer

## 2023-09-23 ENCOUNTER — Encounter (HOSPITAL_COMMUNITY): Payer: Self-pay | Admitting: Psychiatry

## 2023-09-23 ENCOUNTER — Telehealth (HOSPITAL_COMMUNITY): Payer: BLUE CROSS/BLUE SHIELD | Admitting: Psychiatry

## 2023-09-23 VITALS — Wt 162.0 lb

## 2023-09-23 DIAGNOSIS — F0630 Mood disorder due to known physiological condition, unspecified: Secondary | ICD-10-CM | POA: Diagnosis not present

## 2023-09-23 DIAGNOSIS — F419 Anxiety disorder, unspecified: Secondary | ICD-10-CM

## 2023-09-23 DIAGNOSIS — F1910 Other psychoactive substance abuse, uncomplicated: Secondary | ICD-10-CM

## 2023-09-23 MED ORDER — LAMOTRIGINE 25 MG PO TABS: ORAL_TABLET | ORAL | 1 refills | Status: DC
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
## 2023-10-06 ENCOUNTER — Encounter (HOSPITAL_COMMUNITY): Payer: Self-pay

## 2023-10-06 ENCOUNTER — Ambulatory Visit (INDEPENDENT_AMBULATORY_CARE_PROVIDER_SITE_OTHER): Payer: MEDICAID | Admitting: Licensed Clinical Social Worker

## 2023-10-06 DIAGNOSIS — F0630 Mood disorder due to known physiological condition, unspecified: Secondary | ICD-10-CM

## 2023-10-06 DIAGNOSIS — F1910 Other psychoactive substance abuse, uncomplicated: Secondary | ICD-10-CM

## 2023-10-06 DIAGNOSIS — F419 Anxiety disorder, unspecified: Secondary | ICD-10-CM | POA: Diagnosis not present
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
## 2023-10-19 ENCOUNTER — Encounter (HOSPITAL_COMMUNITY): Payer: Self-pay | Admitting: Psychiatry

## 2023-10-19 ENCOUNTER — Telehealth (HOSPITAL_BASED_OUTPATIENT_CLINIC_OR_DEPARTMENT_OTHER): Payer: MEDICAID | Admitting: Psychiatry

## 2023-10-19 VITALS — Wt 162.0 lb

## 2023-10-19 DIAGNOSIS — F319 Bipolar disorder, unspecified: Secondary | ICD-10-CM

## 2023-10-19 DIAGNOSIS — F1910 Other psychoactive substance abuse, uncomplicated: Secondary | ICD-10-CM

## 2023-10-19 DIAGNOSIS — F419 Anxiety disorder, unspecified: Secondary | ICD-10-CM

## 2023-10-19 MED ORDER — LAMOTRIGINE 25 MG PO TABS: ORAL_TABLET | ORAL | 1 refills | Status: DC
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
## 2023-10-21 ENCOUNTER — Ambulatory Visit (INDEPENDENT_AMBULATORY_CARE_PROVIDER_SITE_OTHER): Payer: BLUE CROSS/BLUE SHIELD | Admitting: Licensed Clinical Social Worker

## 2023-10-21 DIAGNOSIS — F419 Anxiety disorder, unspecified: Secondary | ICD-10-CM | POA: Diagnosis not present

## 2023-10-21 DIAGNOSIS — F319 Bipolar disorder, unspecified: Secondary | ICD-10-CM | POA: Diagnosis not present

## 2023-10-21 DIAGNOSIS — F1910 Other psychoactive substance abuse, uncomplicated: Secondary | ICD-10-CM

## 2023-10-21 NOTE — Progress Notes (Signed)
THERAPIST PROGRESS NOTE   Session Date: 10/21/2023  Session Time: 0855 - 0940  Participation Level: Active  Behavioral Response: CasualAlertEuthymic  Type of Therapy: Individual Therapy  Treatment Goals addressed: - LTG: Reduce frequency, intensity, and duration of depression symptoms so that daily functioning is improved (OP Depression) - LTG: Increase coping skills to manage depression and improve ability to perform daily activities (OP Depression) - STG: Tom will identify cognitive patterns and beliefs that support depression (OP Depression) - STG: Dauna will reduce frequency of avoidant behaviors by 50% as evidenced by self-report in therapy sessions (Anxiety) - LTG: Kahlilah will improve quality of life by maintaining ongoing abstinence from all mood-altering substances (Substance Use) - LTG: Devorah will increase coping skills to promote long-term recovery and improve ability to perform daily activities (Substance Use) - LTG: Joei will score less than 5 on the Generalized Anxiety Disorder 7 Scale (GAD-7) (Anxiety)   ProgressTowards Goals: Progressing  Interventions: CBT, Motivational Interviewing, Strength-based, and Family Systems  Summary: Jessica Marshall is a 42 y.o. female with past psych history of Bipolar, Anxiety, and polysubstance use, presenting for follow-up therapy session in efforts to improve management of depressive, anxious and mood d/o related symptoms. Patient actively engaged in session, providing recounts of past two weeks since intake apt, and detailing recent stressors in the home environment. Pt engaged in re-administering of PHQ9 and GAD7, reviewing current scorings and presence of sxs and stressors. Pt further engaged in exploring her responses to stressful situations, noting that she typically engages in conflicts/arguments and later proves to isolate in her room for approx. 18-20 hours out of the day when faced with conflict between herself and other family  members in the home. Encouraged pt to begin increasing time outside in efforts to decrease isolative behaviors. Pt detailed potential interest in spending time outside on family's land as a means to center herself and de-compress. Patient responded well to interventions. Patient continues to meet criteria for Bipolar, Anxiety, and polysubstance use. Patient will continue to benefit from engagement in outpatient therapy due to being the least restrictive service to meet presenting needs.     10/21/2023    8:58 AM 10/06/2023    8:12 AM 09/23/2023   11:56 AM 05/22/2020   11:01 AM  Depression screen PHQ 2/9  Decreased Interest 3 3 3 3   Down, Depressed, Hopeless 2 3 3 3   PHQ - 2 Score 5 6 6 6   Altered sleeping 3 3 3 3   Tired, decreased energy 3 3 3 3   Change in appetite 1 0 2 3  Feeling bad or failure about yourself  3 3 3 3   Trouble concentrating 3 3 3 3   Moving slowly or fidgety/restless 1 3 3 3   Suicidal thoughts 0 0 0 3  PHQ-9 Score 19 21 23 27   Difficult doing work/chores Extremely dIfficult Extremely dIfficult Very difficult       10/21/2023    9:01 AM 10/06/2023    8:10 AM 09/23/2023   11:57 AM  GAD 7 : Generalized Anxiety Score  Nervous, Anxious, on Edge 3 3 3   Control/stop worrying 3 3 3   Worry too much - different things 3 3 3   Trouble relaxing 3 3 3   Restless 3 2 3   Easily annoyed or irritable 3 3 3   Afraid - awful might happen 3 3 3   Total GAD 7 Score 21 20 21   Anxiety Difficulty Extremely difficult Very difficult Very difficult    Suicidal/Homicidal: No  Therapist Response: Clinician utilized CBT,  MI, Strengths Based, and Family systems techniques to address presenting depressive and anxious sxs. Clinician re-administered PHQ9 and GAD7 in order to explore variances in recent experiences with sxs and stressors, engaging pt in discussion surrounding factors and abilities at managing stressors. Supported pt in exploring recent interactions with other family members and  identifying significant stressors in relation to presenting anxious sxs. Evoked pt's perspectives surrounding mood variances and factors that prove to impact her moods, as well as what is within her control and how to improve management of moods. Encouraged pt to increase time outside when stressed, practicing sensory imagery techniques to increase abilities at regulating moods and emotions. Therapist provided support and empathy to patient during session.  Plan: Return again in 2 weeks.  Diagnosis:  Encounter Diagnoses  Name Primary?   Bipolar I disorder (HCC) Yes   Anxiety    Polysubstance abuse (HCC)     Collaboration of Care: Other none deemed necessary at this time.  Patient/Guardian was advised Release of Information must be obtained prior to any record release in order to collaborate their care with an outside provider. Patient/Guardian was advised if they have not already done so to contact the registration department to sign all necessary forms in order for [place] to release information regarding their care.   Consent: Patient/Guardian gives verbal consent for treatment and assignment of benefits for services provided during this visit. Patient/Guardian expressed understanding and agreed to proceed.   Leisa Lenz, MSW, LCSW 10/21/2023,  9:48 AM

## 2023-11-04 ENCOUNTER — Ambulatory Visit (INDEPENDENT_AMBULATORY_CARE_PROVIDER_SITE_OTHER): Payer: BLUE CROSS/BLUE SHIELD | Admitting: Licensed Clinical Social Worker

## 2023-11-04 DIAGNOSIS — F319 Bipolar disorder, unspecified: Secondary | ICD-10-CM

## 2023-11-04 NOTE — Progress Notes (Signed)
THERAPIST PROGRESS NOTE   Session Date: 11/04/2023  Session Time: 0915 - 0958  Participation Level: Active  Behavioral Response: CasualAlertEuthymic  Type of Therapy: Individual Therapy  Treatment Goals addressed: - LTG: Reduce frequency, intensity, and duration of depression symptoms so that daily functioning is improved (OP Depression) - LTG: Increase coping skills to manage depression and improve ability to perform daily activities (OP Depression) - STG: Jessica Marshall will identify cognitive patterns and beliefs that support depression (OP Depression) - STG: Jessica Marshall will reduce frequency of avoidant behaviors by 50% as evidenced by self-report in therapy sessions (Anxiety) - LTG: Jessica Marshall will improve quality of life by maintaining ongoing abstinence from all mood-altering substances (Substance Use) - LTG: Jessica Marshall will increase coping skills to promote long-term recovery and improve ability to perform daily activities (Substance Use) - LTG: Jessica Marshall will score less than 5 on the Generalized Anxiety Disorder 7 Scale (GAD-7) (Anxiety)   ProgressTowards Goals: Progressing  Interventions: CBT, Motivational Interviewing, Strength-based, and Family Systems  Summary: Jessica Marshall is a 42 y.o. female with past psych history of Bipolar, Anxiety, and polysubstance use, presenting for follow-up therapy session in efforts to improve management of depressive, anxious and mood d/o related symptoms. Patient actively engaged in session, providing recounts of events over the past 2 weeks surrounding Thanksgiving holiday and interactions in the home among family members.  Patient provided further recounts of one incident of increased conflicts between herself and mother occurring in the home, resulting in increased stress levels for patient.  Further processed patient's thoughts and feelings surrounding conflict with mother, and factors that are within patient's control.  Explored patient's efforts at completing homework  of tracking moods, further processing noted trends. Patient responded well to interventions. Patient continues to meet criteria for Bipolar, Anxiety, and polysubstance use. Patient will continue to benefit from engagement in outpatient therapy due to being the least restrictive service to meet presenting needs.     11/04/2023    9:36 AM 10/21/2023    8:58 AM 10/06/2023    8:12 AM 09/23/2023   11:56 AM 05/22/2020   11:01 AM  Depression screen PHQ 2/9  Decreased Interest 3 3 3 3 3   Down, Depressed, Hopeless 3 2 3 3 3   PHQ - 2 Score 6 5 6 6 6   Altered sleeping 2 3 3 3 3   Tired, decreased energy 3 3 3 3 3   Change in appetite 3 1 0 2 3  Feeling bad or failure about yourself  3 3 3 3 3   Trouble concentrating 3 3 3 3 3   Moving slowly or fidgety/restless 2 1 3 3 3   Suicidal thoughts 0 0 0 0 3  PHQ-9 Score 22 19 21 23 27   Difficult doing work/chores Extremely dIfficult Extremely dIfficult Extremely dIfficult Very difficult       11/04/2023    9:34 AM 10/21/2023    9:01 AM 10/06/2023    8:10 AM 09/23/2023   11:57 AM  GAD 7 : Generalized Anxiety Score  Nervous, Anxious, on Edge 3 3 3 3   Control/stop worrying 3 3 3 3   Worry too much - different things 3 3 3 3   Trouble relaxing 3 3 3 3   Restless 2 3 2 3   Easily annoyed or irritable 3 3 3 3   Afraid - awful might happen 3 3 3 3   Total GAD 7 Score 20 21 20 21   Anxiety Difficulty Extremely difficult Extremely difficult Very difficult Very difficult    Suicidal/Homicidal: No  Therapist Response: Clinician utilized  CBT, MI, Strengths Based, and Family systems techniques to address presenting depressive and anxious sxs.  Actively listened to patient's recounts of past 2 weeks, eliciting further details, and thoughts and feelings surrounding events, from patient.  Evoked patient's varying perspectives surrounding interactions and relationships occurring among family members in the home, and how patient feels this impacts her.  Readministered  GAD-7 and PHQ-9, engaging patient in processing variances in severity Scaling's.  Supported patient in navigating expressed challenges with a recently beginning to overeat sweets, exploring similarities between patient's history with addiction and the same challenges with sugary sweets.  Encouraged patient to continue to track moods over the coming weeks in order to maintain awareness and reflect on various trends. Therapist provided support and empathy to patient during session.  Plan: Return again in 2 weeks.  Diagnosis:  Encounter Diagnosis  Name Primary?   Bipolar I disorder (HCC) Yes     Collaboration of Care: Other none deemed necessary at this time.  Patient/Guardian was advised Release of Information must be obtained prior to any record release in order to collaborate their care with an outside provider. Patient/Guardian was advised if they have not already done so to contact the registration department to sign all necessary forms in order for [place] to release information regarding their care.   Consent: Patient/Guardian gives verbal consent for treatment and assignment of benefits for services provided during this visit. Patient/Guardian expressed understanding and agreed to proceed.   Leisa Lenz, MSW, LCSW 11/04/2023,  12:25 PM

## 2023-11-19 ENCOUNTER — Ambulatory Visit (INDEPENDENT_AMBULATORY_CARE_PROVIDER_SITE_OTHER): Payer: BLUE CROSS/BLUE SHIELD | Admitting: Licensed Clinical Social Worker

## 2023-11-19 DIAGNOSIS — F419 Anxiety disorder, unspecified: Secondary | ICD-10-CM

## 2023-11-19 DIAGNOSIS — F319 Bipolar disorder, unspecified: Secondary | ICD-10-CM | POA: Diagnosis not present

## 2023-11-19 NOTE — Progress Notes (Signed)
THERAPIST PROGRESS NOTE   Session Date: 11/19/2023  Session Time:  1110- 1200  Virtual Visit via Video Note  I connected with Jessica Marshall on 11/19/23 at 11:10 AM EST by a video enabled telemedicine application and verified that I am speaking with the correct person using two identifiers.  Location: Patient: Home Provider: Home office   I discussed the limitations of evaluation and management by telemedicine and the availability of in person appointments. The patient expressed understanding and agreed to proceed.   The patient was advised to call back or seek an in-person evaluation if the symptoms worsen or if the condition fails to improve as anticipated.  I provided 50 minutes of non-face-to-face time during this encounter.   Participation Level: Active  Behavioral Response: CasualAlertAnxious, Depressed, and Irritable  Type of Therapy: Individual Therapy  Treatment Goals addressed: - LTG: Reduce frequency, intensity, and duration of depression symptoms so that daily functioning is improved (OP Depression) - LTG: Increase coping skills to manage depression and improve ability to perform daily activities (OP Depression) - STG: Nakia will identify cognitive patterns and beliefs that support depression (OP Depression) - STG: Wanell will reduce frequency of avoidant behaviors by 50% as evidenced by self-report in therapy sessions (Anxiety) - LTG: Alizah will improve quality of life by maintaining ongoing abstinence from all mood-altering substances (Substance Use) - LTG: Lakynn will increase coping skills to promote long-term recovery and improve ability to perform daily activities (Substance Use) - LTG: Zori will score less than 5 on the Generalized Anxiety Disorder 7 Scale (GAD-7) (Anxiety)   ProgressTowards Goals: Progressing  Interventions: CBT, Motivational Interviewing, Strength-based, and Family Systems  Summary: Jessica Marshall is a 42 y.o. female with past psych  history of Bipolar, Anxiety, and polysubstance use, presenting for follow-up therapy session in efforts to improve management of depressive, anxious and mood d/o related symptoms. Patient actively engaged in session, maintaining pleasant moods throughout, detailing events of the past two weeks since previous session. Pt shared of having continued tracking moods and identifying improvements in relation to moods compared to previous two weeks, further identifying recent improved interactions with mother. Actively processed recent increased stress related to recent drug court status and additional requirements, exploring pt identified frustrations and increased anxiety. Engaged in re-administering of PHQ9 and GAD7, identifying maintained increase in severity of sxs. Patient responded well to interventions. Patient continues to meet criteria for Bipolar, Anxiety, and polysubstance use. Patient will continue to benefit from engagement in outpatient therapy due to being the least restrictive service to meet presenting needs.     11/19/2023   11:16 AM 11/04/2023    9:36 AM 10/21/2023    8:58 AM 10/06/2023    8:12 AM 09/23/2023   11:56 AM  Depression screen PHQ 2/9  Decreased Interest 3 3 3 3 3   Down, Depressed, Hopeless 3 3 2 3 3   PHQ - 2 Score 6 6 5 6 6   Altered sleeping 3 2 3 3 3   Tired, decreased energy 3 3 3 3 3   Change in appetite 1 3 1  0 2  Feeling bad or failure about yourself  3 3 3 3 3   Trouble concentrating 3 3 3 3 3   Moving slowly or fidgety/restless 3 2 1 3 3   Suicidal thoughts 0 0 0 0 0  PHQ-9 Score 22 22 19 21 23   Difficult doing work/chores Extremely dIfficult Extremely dIfficult Extremely dIfficult Extremely dIfficult Very difficult      11/19/2023   11:13 AM 11/04/2023  9:34 AM 10/21/2023    9:01 AM 10/06/2023    8:10 AM  GAD 7 : Generalized Anxiety Score  Nervous, Anxious, on Edge 3 3 3 3   Control/stop worrying 3 3 3 3   Worry too much - different things 3 3 3 3   Trouble  relaxing 3 3 3 3   Restless 2 2 3 2   Easily annoyed or irritable 3 3 3 3   Afraid - awful might happen 3 3 3 3   Total GAD 7 Score 20 20 21 20   Anxiety Difficulty Extremely difficult Extremely difficult Extremely difficult Very difficult    Suicidal/Homicidal: No  Therapist Response: Clinician utilized CBT, MI, Strengths Based, and Family systems techniques to address presenting depressive and anxious sxs.  Actively supported pt in reflection of recent events, evoking pt's thoughts and feelings surrounding recent court stressors and processing the impact of events on pt's moods. Further explored pt's tracking of moods, eliciting pt's observations in variances in moods and trends. Re-administered GAD-7 and PHQ-9, engaging patient in processing maintained increase in severity of sxs.  Encouraged patient to continue to track moods over the coming weeks in order to maintain awareness and reflect on various trends. Therapist provided support and empathy to patient during session.  Plan: Return again in 2 weeks.  Diagnosis:  Encounter Diagnoses  Name Primary?   Bipolar I disorder (HCC) Yes   Anxiety      Collaboration of Care: Other none deemed necessary at this time.  Patient/Guardian was advised Release of Information must be obtained prior to any record release in order to collaborate their care with an outside provider. Patient/Guardian was advised if they have not already done so to contact the registration department to sign all necessary forms in order for [place] to release information regarding their care.   Consent: Patient/Guardian gives verbal consent for treatment and assignment of benefits for services provided during this visit. Patient/Guardian expressed understanding and agreed to proceed.   Leisa Lenz, MSW, LCSW 11/19/2023,  11:22 AM

## 2023-11-30 ENCOUNTER — Ambulatory Visit (HOSPITAL_COMMUNITY): Payer: BLUE CROSS/BLUE SHIELD | Admitting: Licensed Clinical Social Worker

## 2023-12-03 ENCOUNTER — Ambulatory Visit (INDEPENDENT_AMBULATORY_CARE_PROVIDER_SITE_OTHER): Payer: BLUE CROSS/BLUE SHIELD | Admitting: Licensed Clinical Social Worker

## 2023-12-03 DIAGNOSIS — F419 Anxiety disorder, unspecified: Secondary | ICD-10-CM

## 2023-12-03 DIAGNOSIS — F319 Bipolar disorder, unspecified: Secondary | ICD-10-CM
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
## 2023-12-14 ENCOUNTER — Encounter (HOSPITAL_COMMUNITY): Payer: Self-pay | Admitting: Psychiatry

## 2023-12-14 ENCOUNTER — Telehealth (HOSPITAL_BASED_OUTPATIENT_CLINIC_OR_DEPARTMENT_OTHER): Payer: BLUE CROSS/BLUE SHIELD | Admitting: Psychiatry

## 2023-12-14 ENCOUNTER — Ambulatory Visit (HOSPITAL_COMMUNITY): Payer: BLUE CROSS/BLUE SHIELD | Admitting: Licensed Clinical Social Worker

## 2023-12-14 VITALS — Wt 172.0 lb

## 2023-12-14 DIAGNOSIS — F1910 Other psychoactive substance abuse, uncomplicated: Secondary | ICD-10-CM | POA: Diagnosis not present

## 2023-12-14 DIAGNOSIS — F419 Anxiety disorder, unspecified: Secondary | ICD-10-CM

## 2023-12-14 DIAGNOSIS — F319 Bipolar disorder, unspecified: Secondary | ICD-10-CM

## 2023-12-14 MED ORDER — LAMOTRIGINE 150 MG PO TABS: 150.0000 mg | ORAL_TABLET | Freq: Every day | ORAL | 1 refills | Status: DC
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
## 2023-12-17 ENCOUNTER — Ambulatory Visit (INDEPENDENT_AMBULATORY_CARE_PROVIDER_SITE_OTHER): Payer: BLUE CROSS/BLUE SHIELD | Admitting: Licensed Clinical Social Worker

## 2023-12-17 DIAGNOSIS — F1910 Other psychoactive substance abuse, uncomplicated: Secondary | ICD-10-CM

## 2023-12-17 DIAGNOSIS — F319 Bipolar disorder, unspecified: Secondary | ICD-10-CM | POA: Diagnosis not present

## 2023-12-17 DIAGNOSIS — F419 Anxiety disorder, unspecified: Secondary | ICD-10-CM

## 2023-12-17 NOTE — Progress Notes (Signed)
THERAPIST PROGRESS NOTE   Session Date: 12/17/2023  Session Time:  1008 - 1059 Virtual Visit via Video Note  I connected with Jessica Marshall on 12/17/23 at 10:08 AM EST by a video enabled telemedicine application and verified that I am speaking with the correct person using two identifiers.  Location: Patient: Home Provider: Home Office   I discussed the limitations of evaluation and management by telemedicine and the availability of in person appointments. The patient expressed understanding and agreed to proceed.   The patient was advised to call back or seek an in-person evaluation if the symptoms worsen or if the condition fails to improve as anticipated.  I provided 51 minutes of non-face-to-face time during this encounter.  Participation Level: Active  Behavioral Response: CasualAlertEuthymic  Type of Therapy: Individual Therapy  Treatment Goals addressed: - LTG: Reduce frequency, intensity, and duration of depression symptoms so that daily functioning is improved (OP Depression) - LTG: Increase coping skills to manage depression and improve ability to perform daily activities (OP Depression) - STG: Feliz will identify cognitive patterns and beliefs that support depression (OP Depression) - STG: Kristyn will reduce frequency of avoidant behaviors by 50% as evidenced by self-report in therapy sessions (Anxiety) - LTG: Takaya will improve quality of life by maintaining ongoing abstinence from all mood-altering substances (Substance Use) - LTG: Othell will increase coping skills to promote long-term recovery and improve ability to perform daily activities (Substance Use) - LTG: Lus will score less than 5 on the Generalized Anxiety Disorder 7 Scale (GAD-7) (Anxiety)   ProgressTowards Goals: Progressing  Interventions: CBT, Motivational Interviewing, Strength-based, and Family Systems  Summary: Jessica Marshall is a 43 y.o. female with past psych history of Bipolar, Anxiety, and  polysubstance use, presenting for follow-up therapy session in efforts to improve management of depressive, anxious and mood d/o related symptoms. Patient actively engaged in session, presenting in pleasant moods and congruent affect throughout. Pt actively detailed recent events of the past two weeks, sharing recounts of [facility] programing and enjoyment obtained from engaging and learning impacts substances have on lives. Pt further shared comfort and acceptance in having to postpone beginning [employer] this semester due to court, probation, and treatment requirements consuming 3-4 days a week, preventing her from being able to do so. Began further exploring pt hx, resuming from time of obtaining GED at approx. 43yo, providing brief recounts of timeline to current day. Further explored pt's hx at managing stress and relationship between cause and effect.  Patient responded well to interventions. Patient continues to meet criteria for Bipolar, Anxiety, and polysubstance use. Patient will continue to benefit from engagement in outpatient therapy due to being the least restrictive service to meet presenting needs.      12/17/2023   10:18 AM 11/19/2023   11:16 AM 11/04/2023    9:36 AM 10/21/2023    8:58 AM 10/06/2023    8:12 AM  Depression screen PHQ 2/9  Decreased Interest 3 3 3 3 3   Down, Depressed, Hopeless 3 3 3 2 3   PHQ - 2 Score 6 6 6 5 6   Altered sleeping 2 3 2 3 3   Tired, decreased energy 3 3 3 3 3   Change in appetite 2 1 3 1  0  Feeling bad or failure about yourself  3 3 3 3 3   Trouble concentrating 3 3 3 3 3   Moving slowly or fidgety/restless 2 3 2 1 3   Suicidal thoughts 0 0 0 0 0  PHQ-9 Score 21 22 22  19  21  Difficult doing work/chores Very difficult Extremely dIfficult Extremely dIfficult Extremely dIfficult Extremely dIfficult      12/17/2023   10:16 AM 11/19/2023   11:13 AM 11/04/2023    9:34 AM 10/21/2023    9:01 AM  GAD 7 : Generalized Anxiety Score  Nervous, Anxious, on Edge 3 3 3 3    Control/stop worrying 3 3 3 3   Worry too much - different things 3 3 3 3   Trouble relaxing 3 3 3 3   Restless 3 2 2 3   Easily annoyed or irritable 3 3 3 3   Afraid - awful might happen 3 3 3 3   Total GAD 7 Score 21 20 20 21   Anxiety Difficulty Extremely difficult Extremely difficult Extremely difficult Extremely difficult    Suicidal/Homicidal: No  Therapist Response: Clinician utilized CBT, MI, Strengths Based, and Family systems techniques to address presenting depressive and anxious sxs. Actively listened to pt's recounts of recent events, eliciting pt's thoughts, feelings, and perspectives surrounding experiences from ADS program and ongoing Drug Court and probation requirements. Evoked pt's understanding and perspectives related to cause and effect relationship surrounding hx of use. Further evoked pt's perspectives of hx of events throughout years from late adolescence/early adulthood to present day.  Clinician reassessed severity of sxs, and presence of any safety concerns. Clinician provided support and empathy to patient during session.   Plan: Return again in 2 weeks.  Diagnosis:  Encounter Diagnoses  Name Primary?   Bipolar I disorder (HCC) Yes   Anxiety    Polysubstance abuse (HCC)     Collaboration of Care: Other none deemed necessary at this time.  Patient/Guardian was advised Release of Information must be obtained prior to any record release in order to collaborate their care with an outside provider. Patient/Guardian was advised if they have not already done so to contact the registration department to sign all necessary forms in order for [place] to release information regarding their care.   Consent: Patient/Guardian gives verbal consent for treatment and assignment of benefits for services provided during this visit. Patient/Guardian expressed understanding and agreed to proceed.   Leisa Lenz, MSW, LCSW 12/17/2023,  10:19 AM

## 2023-12-22 ENCOUNTER — Telehealth (HOSPITAL_COMMUNITY): Payer: Self-pay | Admitting: *Deleted

## 2023-12-22 NOTE — Telephone Encounter (Signed)
Writer received a VM yesterday (12/21/23) from Aggie Cosier, nurse at pt's PCP, Dr. Sherron Flemings with [facility]. Message from Aggie Cosier was that pt was asking for a certain medication and Dr. Sherron Flemings had question r/t that. This nurse did see that pt had a visit yesterday and Klonopin 0.5 mg ghs was prescribed at that visit. Writer has attempted to return call and speak with Aggie Cosier, however I have not been able to speak with anyone and have been on hold up to 10 minutes. Will tryagain. FYI.

## 2023-12-28 ENCOUNTER — Ambulatory Visit (HOSPITAL_COMMUNITY): Payer: BLUE CROSS/BLUE SHIELD | Admitting: Licensed Clinical Social Worker

## 2023-12-29 ENCOUNTER — Ambulatory Visit (HOSPITAL_COMMUNITY): Payer: BLUE CROSS/BLUE SHIELD | Admitting: Licensed Clinical Social Worker

## 2023-12-31 ENCOUNTER — Ambulatory Visit (HOSPITAL_COMMUNITY): Payer: BLUE CROSS/BLUE SHIELD | Admitting: Licensed Clinical Social Worker

## 2024-01-13 ENCOUNTER — Ambulatory Visit (INDEPENDENT_AMBULATORY_CARE_PROVIDER_SITE_OTHER): Payer: MEDICAID | Admitting: Licensed Clinical Social Worker

## 2024-01-13 DIAGNOSIS — Z91199 Patient's noncompliance with other medical treatment and regimen due to unspecified reason: Secondary | ICD-10-CM

## 2024-01-13 NOTE — Progress Notes (Signed)
 THERAPIST PROGRESS NOTE   Session Date: 01/13/2024  Session Time: 1500  Jessica Marshall    Clinician attempted to connect with patient for scheduled appointment via MyChart video, sending text request x2 with no response; also attempted to connect via phone without success. Clinician left message for patient to call office to reschedule therapy appointment.     Attempt 1: Text and email: 1505   Attempt 2: Text: 1510   Left video chat open until:  1514     Per [place] policy, after multiple attempts to reach patient unsuccessfully at appointed time, visit will be coded as a no show.  Leisa Lenz, MSW, LCSW 01/13/2024,  3:18 PM

## 2024-01-26 ENCOUNTER — Ambulatory Visit (INDEPENDENT_AMBULATORY_CARE_PROVIDER_SITE_OTHER): Payer: BLUE CROSS/BLUE SHIELD | Admitting: Licensed Clinical Social Worker

## 2024-01-26 DIAGNOSIS — Z91199 Patient's noncompliance with other medical treatment and regimen due to unspecified reason: Secondary | ICD-10-CM

## 2024-01-26 NOTE — Progress Notes (Deleted)
 THERAPIST PROGRESS NOTE   Session Date: 01/26/2024  Session Time: 1600  Patient no-showed today's appointment; appointment was for follow up therapy, provider notified for review of record.   Leisa Lenz, MSW, LCSW 01/26/2024,  9:41 PM

## 2024-01-26 NOTE — Progress Notes (Signed)
 THERAPIST PROGRESS NOTE   Session Date: 01/26/2024  Session Time: 1600  Patient no-showed today's appointment; appointment was for follow up therapy session.   Leisa Lenz, MSW, LCSW 01/26/2024,  9:43 PM

## 2024-01-28 ENCOUNTER — Ambulatory Visit (HOSPITAL_COMMUNITY): Payer: BLUE CROSS/BLUE SHIELD | Admitting: Licensed Clinical Social Worker

## 2024-02-02 ENCOUNTER — Encounter (HOSPITAL_COMMUNITY): Payer: Self-pay

## 2024-02-05 ENCOUNTER — Other Ambulatory Visit (HOSPITAL_COMMUNITY): Payer: Self-pay | Admitting: Psychiatry

## 2024-02-05 DIAGNOSIS — F319 Bipolar disorder, unspecified: Secondary | ICD-10-CM

## 2024-02-05 DIAGNOSIS — F419 Anxiety disorder, unspecified: Secondary | ICD-10-CM

## 2024-02-05 DIAGNOSIS — F1910 Other psychoactive substance abuse, uncomplicated: Secondary | ICD-10-CM

## 2024-02-09 ENCOUNTER — Ambulatory Visit (HOSPITAL_COMMUNITY): Payer: BLUE CROSS/BLUE SHIELD | Admitting: Licensed Clinical Social Worker

## 2024-02-12 ENCOUNTER — Telehealth (HOSPITAL_COMMUNITY): Admitting: Psychiatry

## 2024-02-23 ENCOUNTER — Ambulatory Visit (INDEPENDENT_AMBULATORY_CARE_PROVIDER_SITE_OTHER): Payer: MEDICAID | Admitting: Licensed Clinical Social Worker

## 2024-02-23 DIAGNOSIS — F419 Anxiety disorder, unspecified: Secondary | ICD-10-CM

## 2024-02-23 DIAGNOSIS — F319 Bipolar disorder, unspecified: Secondary | ICD-10-CM

## 2024-02-23 DIAGNOSIS — F1910 Other psychoactive substance abuse, uncomplicated: Secondary | ICD-10-CM | POA: Diagnosis not present

## 2024-02-23 NOTE — Progress Notes (Unsigned)
 THERAPIST PROGRESS NOTE   Session Date: 02/23/2024  Session Time:  1505 - 1552 Virtual Visit via Video Note  I connected with Jessica Marshall on 02/23/24 at 15:05 PM EST by a video enabled telemedicine application and verified that I am speaking with the correct person using two identifiers.  Location: Patient: Home Provider: Home Office   I discussed the limitations of evaluation and management by telemedicine and the availability of in person appointments. The patient expressed understanding and agreed to proceed.   The patient was advised to call back or seek an in-person evaluation if the symptoms worsen or if the condition fails to improve as anticipated.  I provided 46 minutes of non-face-to-face time during this encounter.  Participation Level: Active  Behavioral Response: CasualAlertEuthymic  Type of Therapy: Individual Therapy  Treatment Goals addressed: - LTG: Reduce frequency, intensity, and duration of depression symptoms so that daily functioning is improved (OP Depression) - LTG: Increase coping skills to manage depression and improve ability to perform daily activities (OP Depression) - STG: Breindy will identify cognitive patterns and beliefs that support depression (OP Depression) - STG: Dreanna will reduce frequency of avoidant behaviors by 50% as evidenced by self-report in therapy sessions (Anxiety) - LTG: Heatherly will improve quality of life by maintaining ongoing abstinence from all mood-altering substances (Substance Use) - LTG: Ieasha will increase coping skills to promote long-term recovery and improve ability to perform daily activities (Substance Use) - LTG: Coni will score less than 5 on the Generalized Anxiety Disorder 7 Scale (GAD-7) (Anxiety)   ProgressTowards Goals: Progressing  Interventions: CBT, Motivational Interviewing, Strength-based, and Family Systems  Summary: Jessica Marshall is a 43 y.o. female with past psych history of Bipolar, Anxiety, and  polysubstance use, presenting for follow-up therapy session in efforts to improve management of depressive, anxious and mood d/o related symptoms.    Patient actively engaged in check-in, presenting in pleasant moods, and congruent affect throughout duration of visit. Pt openly engaged in detailing recounts of events since previous session, sharing of having recently served 30-days in jail, from 2/8-3/10, due to drug court requiring that pt complete 30-days in [place] facility. Pt further shared having originally been informed that 30-days were to be complete on weekends only, then being instructed by program personnel of being every other weekend, and then informed of need for 30 consecutive days due to prior charges from alternate county and discrepancies in [place]. Pt shared further of  being sentenced to 30 days after attending drug court following leaving [facility] due to inconsistencies. Pt noted of having maintained medications while in jail, however not receiving any therapy services throughout duration.  Patient further detailed having resumed SUD IOP with [facility] 3 times weekly, sharing of finding this to be a beneficial support and learning a significant deal surrounding substance use and the implications of use.  Actively engaged in reassessing of presenting depressive and anxious symptoms via PHQ-9 and GAD-7, noting of reduction in depressive symptoms in comparison to prior screenings greater than 30%, and reduction of anxious symptoms by 20%, sharing of things overall have been going well since serving 30-day sentence.  Patient shared of preparing to begin online schooling program in 2 weeks, as well as having been spending time relaxing, watching movies, taking walks, and in time with niece regularly.  Actively explored efforts at managing presenting stressors and symptoms, noting of having not journaled since being incarcerated, but having recently reflected on the benefits of journaling, of being able  to write  down feelings and emotions and process moods and experiences and common trends. Identified observable changes, expressing feeling more positive than previously, able to be more consciously aware of need to implement boundaries as had started noticing increased urges when handing around with friends used to use with.   Patient responded well to interventions. Patient continues to meet criteria for Bipolar, Anxiety, and polysubstance use. Patient will continue to benefit from engagement in outpatient therapy due to being the least restrictive service to meet presenting needs.      02/23/2024    3:18 PM 12/17/2023   10:18 AM 11/19/2023   11:16 AM 11/04/2023    9:36 AM 10/21/2023    8:58 AM  Depression screen PHQ 2/9  Decreased Interest 3 3 3 3 3   Down, Depressed, Hopeless 2 3 3 3 2   PHQ - 2 Score 5 6 6 6 5   Altered sleeping 2 2 3 2 3   Tired, decreased energy 1 3 3 3 3   Change in appetite 1 2 1 3 1   Feeling bad or failure about yourself  2 3 3 3 3   Trouble concentrating 3 3 3 3 3   Moving slowly or fidgety/restless 0 2 3 2 1   Suicidal thoughts 0 0 0 0 0  PHQ-9 Score 14 21 22 22 19   Difficult doing work/chores Very difficult Very difficult Extremely dIfficult Extremely dIfficult Extremely dIfficult      02/23/2024    3:17 PM 12/17/2023   10:16 AM 11/19/2023   11:13 AM 11/04/2023    9:34 AM  GAD 7 : Generalized Anxiety Score  Nervous, Anxious, on Edge 3 3 3 3   Control/stop worrying 3 3 3 3   Worry too much - different things 2 3 3 3   Trouble relaxing 2 3 3 3   Restless 3 3 2 2   Easily annoyed or irritable 2 3 3 3   Afraid - awful might happen 2 3 3 3   Total GAD 7 Score 17 21 20 20   Anxiety Difficulty Very difficult Extremely difficult Extremely difficult Extremely difficult    Suicidal/Homicidal: No  Therapist Response: Clinician utilized CBT, MI, Strengths Based, and Family systems techniques to address presenting depressive and anxious sxs.   Openly greeted patient, engaging  in introductory check-in, assessing presenting moods and affect, actively listening to patient's recounts of recent events, providing support and processing recent 30-day incarceration and contributing factors and challenges.  Utilized open-ended questions to further elicit recounts of events, processing presents of noted symptoms and explored efforts at managing stressors.  Supported patient in review and encouragement of behavioral activation techniques and efforts to increase practice of journaling to support and management of depressive and anxious symptoms, as evidenced by patient's reports of increased benefits surrounding understanding and management of moods.  Clinician reassessed severity of sxs, and presence of any safety concerns. Clinician provided support and empathy to patient during session.   Plan: Return again in 2 weeks.  Diagnosis:  Encounter Diagnoses  Name Primary?   Bipolar I disorder (HCC) Yes   Anxiety    Polysubstance abuse (HCC)    Collaboration of Care: Other none deemed necessary at this time.  Patient/Guardian was advised Release of Information must be obtained prior to any record release in order to collaborate their care with an outside provider. Patient/Guardian was advised if they have not already done so to contact the registration department to sign all necessary forms in order for [place] to release information regarding their care.   Consent: Patient/Guardian gives verbal  consent for treatment and assignment of benefits for services provided during this visit. Patient/Guardian expressed understanding and agreed to proceed.   Leisa Lenz, MSW, LCSW 02/23/2024,  3:20 PM

## 2024-03-08 ENCOUNTER — Other Ambulatory Visit (HOSPITAL_COMMUNITY): Payer: Self-pay | Admitting: Psychiatry

## 2024-03-08 DIAGNOSIS — F319 Bipolar disorder, unspecified: Secondary | ICD-10-CM

## 2024-03-08 DIAGNOSIS — F1910 Other psychoactive substance abuse, uncomplicated: Secondary | ICD-10-CM

## 2024-03-08 DIAGNOSIS — F419 Anxiety disorder, unspecified: Secondary | ICD-10-CM

## 2024-03-10 ENCOUNTER — Ambulatory Visit (INDEPENDENT_AMBULATORY_CARE_PROVIDER_SITE_OTHER): Payer: MEDICAID | Admitting: Psychiatry

## 2024-03-10 ENCOUNTER — Encounter (HOSPITAL_COMMUNITY): Payer: Self-pay | Admitting: Psychiatry

## 2024-03-10 VITALS — BP 117/65 | HR 84 | Resp 18 | Ht 67.0 in | Wt 191.4 lb

## 2024-03-10 DIAGNOSIS — F1910 Other psychoactive substance abuse, uncomplicated: Secondary | ICD-10-CM | POA: Diagnosis not present

## 2024-03-10 DIAGNOSIS — F419 Anxiety disorder, unspecified: Secondary | ICD-10-CM

## 2024-03-10 DIAGNOSIS — F319 Bipolar disorder, unspecified: Secondary | ICD-10-CM | POA: Diagnosis not present

## 2024-03-10 MED ORDER — QUETIAPINE FUMARATE 100 MG PO TABS: 100.0000 mg | ORAL_TABLET | Freq: Every day | ORAL | 1 refills | Status: DC

## 2024-03-10 MED ORDER — LAMOTRIGINE 200 MG PO TABS: 200.0000 mg | ORAL_TABLET | Freq: Every day | ORAL | 1 refills | Status: DC
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
## 2024-03-15 ENCOUNTER — Encounter (HOSPITAL_COMMUNITY): Payer: Self-pay

## 2024-03-15 ENCOUNTER — Ambulatory Visit (INDEPENDENT_AMBULATORY_CARE_PROVIDER_SITE_OTHER): Payer: MEDICAID | Admitting: Licensed Clinical Social Worker

## 2024-03-15 DIAGNOSIS — Z91199 Patient's noncompliance with other medical treatment and regimen due to unspecified reason: Secondary | ICD-10-CM

## 2024-03-15 NOTE — Progress Notes (Signed)
 THERAPIST PROGRESS NOTE   Session Date: 03/15/2024  Session Time: 1500   Josette Nielsen    Clinician attempted to connect with patient for scheduled appointment via Caregility video, sending text request x3 with no response.     Attempt 1: Text: 1505   Attempt 2: Text: 1508    Attempt 3: Text: 1510   Disconnected video visit at:  1514     Per [facility] policy, after multiple attempts to reach patient unsuccessfully at appointed time, visit will be coded as a no show.  Patsi Boots, MSW, LCSW 03/15/2024,  3:14 PM

## 2024-03-29 ENCOUNTER — Ambulatory Visit (INDEPENDENT_AMBULATORY_CARE_PROVIDER_SITE_OTHER): Payer: MEDICAID | Admitting: Licensed Clinical Social Worker

## 2024-03-29 ENCOUNTER — Encounter (HOSPITAL_COMMUNITY): Payer: Self-pay

## 2024-03-29 DIAGNOSIS — Z91199 Patient's noncompliance with other medical treatment and regimen due to unspecified reason: Secondary | ICD-10-CM

## 2024-03-29 NOTE — Progress Notes (Signed)
 THERAPIST PROGRESS NOTE   Session Date: 03/29/2024  Session Time: 1100  Patient no-showed today's appointment; appointment was for follow up therapy session. Pt contacted office approx. 1.5hr prior to scheduled appt time to cancel. Pt aware appt will be considered no-show due to late notice of cancellation.   Jessica Marshall, MSW, LCSW 03/29/2024,  10:13 AM

## 2024-04-12 ENCOUNTER — Encounter (HOSPITAL_COMMUNITY): Payer: Self-pay

## 2024-04-12 ENCOUNTER — Ambulatory Visit (INDEPENDENT_AMBULATORY_CARE_PROVIDER_SITE_OTHER): Payer: MEDICAID | Admitting: Licensed Clinical Social Worker

## 2024-04-12 DIAGNOSIS — F1910 Other psychoactive substance abuse, uncomplicated: Secondary | ICD-10-CM

## 2024-04-12 DIAGNOSIS — F319 Bipolar disorder, unspecified: Secondary | ICD-10-CM

## 2024-04-12 NOTE — Progress Notes (Signed)
 THERAPIST PROGRESS NOTE   Session Date: 04/12/2024  Session Time:  1105 - 1152 Virtual Visit via Video Note  I connected with Jessica Marshall on 04/12/24 at 11:05 PM EST by a video enabled telemedicine application and verified that I am speaking with the correct person using two identifiers.  Location: Patient: Home Provider: Home Office   I discussed the limitations of evaluation and management by telemedicine and the availability of in person appointments. The patient expressed understanding and agreed to proceed.   The patient was advised to call back or seek an in-person evaluation if the symptoms worsen or if the condition fails to improve as anticipated.  I provided 46 minutes of non-face-to-face time during this encounter.  Participation Level: Active  Behavioral Response: CasualAlertEuthymic  Type of Therapy: Individual Therapy  Treatment Goals addressed: Progressing (3) Reduce frequency, intensity, and duration of depression symptoms so that daily functioning is improved (OP Depression) Increase coping skills to manage depression and improve ability to perform daily activities (OP Depression) Delorice will identify cognitive patterns and beliefs that support depression (OP Depression)  Not Met (add Reason) (1) Chelcey will score less than 5 on the Generalized Anxiety Disorder 7 Scale (GAD-7) (Anxiety)  Initial (1) STG: Report a decrease in anxiety symptoms as evidenced by an overall reduction in anxiety score by a minimum of 25% on the Generalized Anxiety Disorder Scale (GAD-7) (Anxiety)  Completed/Met (4) Jolee will reduce frequency of avoidant behaviors by 50% as evidenced by self-report in therapy sessions (Anxiety) Meika will improve quality of life by maintaining ongoing abstinence from all mood-altering substances (Substance Use) Graelyn will increase coping skills to promote long-term recovery and improve ability to perform daily activities (Substance  Use)  ProgressTowards Goals: Progressing  Interventions: CBT, Motivational Interviewing, Strength-based, and Family Systems  Summary: Jessica Marshall is a 43 y.o. female with past psych history of Bipolar, Anxiety, and polysubstance use, presenting for follow-up therapy session in efforts to improve management of depressive, anxious and mood d/o related symptoms.   Patient actively engaged in check-in, presenting in pleasant moods, and congruent affect throughout visit. Pt openly engaged in introductory check-in, sharing of having missed last two appt's due to forgetting of scheduled visits. Further detailed of having been attending [facility] 3x/week, going to IOP and completing daily programming and finding journaling helpful. Reports of having been taking medication as prescribed and having found [facility] informative and enjoying the family support day has helped grandmothers understanding of use and further improved their relationship.  Patient shared of things having gone well over the past 6 to 7 weeks since previous visit, noting of support through [facility] program aiding patient in gaining greater understanding of triggers and challenges with moods.  Patient further shared of intending on beginning school over the coming weeks, expressing excitement.  Actively engaged in reassessing depressive and anxious symptoms via PHQ-9 and GAD-7, further exploring downward trends in both depressive and anxious symptoms in comparison to 3-4 months ago.  Further engaged in 6 months review of individualized treatment goals outlined in treatment plan, determining of having met 3 of 7 goals, discontinuing 1 goal due to unrealistic nature, and adding a new goal to be more realistic towards management of anxious symptoms.  Patient responded well to interventions. Patient continues to meet criteria for Bipolar, Anxiety, and polysubstance use. Patient will continue to benefit from engagement in outpatient therapy due to being the least  restrictive service to meet presenting needs.      04/12/2024   11:25  AM 02/23/2024    3:18 PM 12/17/2023   10:18 AM 11/19/2023   11:16 AM 11/04/2023    9:36 AM  Depression screen PHQ 2/9  Decreased Interest 2 3 3 3 3   Down, Depressed, Hopeless 2 2 3 3 3   PHQ - 2 Score 4 5 6 6 6   Altered sleeping 0 2 2 3 2   Tired, decreased energy 1 1 3 3 3   Change in appetite 0 1 2 1 3   Feeling bad or failure about yourself  1 2 3 3 3   Trouble concentrating 2 3 3 3 3   Moving slowly or fidgety/restless 1 0 2 3 2   Suicidal thoughts 0 0 0 0 0  PHQ-9 Score 9 14 21 22 22   Difficult doing work/chores Very difficult Very difficult Very difficult Extremely dIfficult Extremely dIfficult      04/12/2024   11:23 AM 02/23/2024    3:17 PM 12/17/2023   10:16 AM 11/19/2023   11:13 AM  GAD 7 : Generalized Anxiety Score  Nervous, Anxious, on Edge 2 3 3 3   Control/stop worrying 2 3 3 3   Worry too much - different things 3 2 3 3   Trouble relaxing 2 2 3 3   Restless 2 3 3 2   Easily annoyed or irritable 2 2 3 3   Afraid - awful might happen 2 2 3 3   Total GAD 7 Score 15 17 21 20   Anxiety Difficulty Very difficult Very difficult Extremely difficult Extremely difficult    Suicidal/Homicidal: No  Therapist Response: Clinician utilized CBT, MI, Strengths Based, and Family systems techniques to address presenting depressive and anxious sxs.   Clinician actively greeted patient upon joining today's virtual visit, engaging in introductory check-in, assessing presenting moods and affect, and further prompting reflections of events of recent weeks and factors contributing to presenting moods.  Actively listened to patient's reflections of recent events, supporting patient and exploring thoughts and feelings surrounding recent events and progressions, as well as observed improvements in moods.  Engaged in reassessing observed depressive and anxious symptoms over the past 2 weeks via PHQ-9 and GAD-7, further processing variances  and scores throughout treatment thus far.  Engaged in 6 months review of individualized treatment goals outlined in treatment plan, exploring patient's perspective of progression, completed goals, and goals needing revision.  Clinician reassessed severity of sxs, and presence of any safety concerns. Clinician provided support and empathy to patient during session.   Plan: Return again in 2 weeks.  Diagnosis:  Encounter Diagnoses  Name Primary?   Bipolar I disorder (HCC) Yes   Polysubstance abuse (HCC)     Collaboration of Care: Other none deemed necessary at this time.  Patient/Guardian was advised Release of Information must be obtained prior to any record release in order to collaborate their care with an outside provider. Patient/Guardian was advised if they have not already done so to contact the registration department to sign all necessary forms in order for us  to release information regarding their care.   Consent: Patient/Guardian gives verbal consent for treatment and assignment of benefits for services provided during this visit. Patient/Guardian expressed understanding and agreed to proceed.   Patsi Boots, MSW, LCSW 04/12/2024,  11:27 AM

## 2024-04-26 ENCOUNTER — Ambulatory Visit (INDEPENDENT_AMBULATORY_CARE_PROVIDER_SITE_OTHER): Payer: MEDICAID | Admitting: Licensed Clinical Social Worker

## 2024-04-26 DIAGNOSIS — F319 Bipolar disorder, unspecified: Secondary | ICD-10-CM

## 2024-04-26 NOTE — Progress Notes (Signed)
 THERAPIST PROGRESS NOTE   Session Date: 04/26/2024  Session Time:  1405 - 1448 Virtual Visit via Video Note  I connected with Jessica Marshall on 04/26/24 at  2:00 PM EDT by a video enabled telemedicine application and verified that I am speaking with the correct person using two identifiers.  Location: Patient: Home Provider: Home Office   I discussed the limitations of evaluation and management by telemedicine and the availability of in person appointments. The patient expressed understanding and agreed to proceed.  I discussed the assessment and treatment plan with the patient. The patient was provided an opportunity to ask questions and all were answered. The patient agreed with the plan and demonstrated an understanding of the instructions.   The patient was advised to call back or seek an in-person evaluation if the symptoms worsen or if the condition fails to improve as anticipated.  I provided 42 minutes of non-face-to-face time during this encounter.  Participation Level: Active  Behavioral Response: CasualAlertEuthymic  Type of Therapy: Individual Therapy  Treatment Goals addressed: Progressing (3) Reduce frequency, intensity, and duration of depression symptoms so that daily functioning is improved (OP Depression) Increase coping skills to manage depression and improve ability to perform daily activities (OP Depression) Jessica Marshall will identify cognitive patterns and beliefs that support depression (OP Depression)  Not Met (add Reason) (1) Jessica Marshall will score less than 5 on the Generalized Anxiety Disorder 7 Scale (GAD-7) (Anxiety)  Initial (1) STG: Report a decrease in anxiety symptoms as evidenced by an overall reduction in anxiety score by a minimum of 25% on the Generalized Anxiety Disorder Scale (GAD-7) (Anxiety)  Completed/Met (4) Jessica Marshall will reduce frequency of avoidant behaviors by 50% as evidenced by self-report in therapy sessions (Anxiety) Jessica Marshall will improve  quality of life by maintaining ongoing abstinence from all mood-altering substances (Substance Use) Jessica Marshall will increase coping skills to promote long-term recovery and improve ability to perform daily activities (Substance Use)  ProgressTowards Goals: Progressing  Interventions: CBT, Motivational Interviewing, Strength-based, and Family Systems  Summary: Jessica Marshall is a 43 y.o. female with past psych history of Bipolar, Anxiety, and polysubstance use, presenting for follow-up therapy session in efforts to improve management of depressive, anxious and mood d/o related symptoms.   Patient actively engaged in check-in, presenting in pleasant moods, and congruent affect throughout visit. Pt openly engaged in introductory check-in, sharing of things having been going "okay", actively engaged in in reassessing presenting depressive and anxious sxs via PHQ-9 and GAD-7, reporting of primarily being worried about securing refill for bipolar medication with uncertainty surrounding which provider will be continuing to write medication prescriptions. Reviewed barriers to beginning school during summer session, related to financial aid, and having postponed enrollment to fall. Pt shared upcoming graduation from [facility] program on 6/16, and continued engagement in 1-2 days/week for 1 hr aftercare program support. Continuing to journal, finding supportive, using prompts given from IOP. Considering beginning NA for added support, feel relationship with mother has improved recently having not been arguing and mother expressing interest in securing a counselor with [employer] also. Has upcoming visit for chronic neck pain to explore shots and PT. Shared feeling of potentially going through menopause due to sxs of hot flashes.  Patient responded well to interventions. Patient continues to meet criteria for Bipolar, Anxiety, and polysubstance use. Patient will continue to benefit from engagement in outpatient therapy due to being the  least restrictive service to meet presenting needs.      04/26/2024    2:10  PM 04/12/2024   11:25 AM 02/23/2024    3:18 PM 12/17/2023   10:18 AM 11/19/2023   11:16 AM  Depression screen PHQ 2/9  Decreased Interest 2 2 3 3 3   Down, Depressed, Hopeless 1 2 2 3 3   PHQ - 2 Score 3 4 5 6 6   Altered sleeping 1 0 2 2 3   Tired, decreased energy 3 1 1 3 3   Change in appetite 0 0 1 2 1   Feeling bad or failure about yourself  1 1 2 3 3   Trouble concentrating 3 2 3 3 3   Moving slowly or fidgety/restless 0 1 0 2 3  Suicidal thoughts 0 0 0 0 0  PHQ-9 Score 11 9 14 21 22   Difficult doing work/chores Very difficult Very difficult Very difficult Very difficult Extremely dIfficult      04/26/2024    2:08 PM 04/12/2024   11:23 AM 02/23/2024    3:17 PM 12/17/2023   10:16 AM  GAD 7 : Generalized Anxiety Score  Nervous, Anxious, on Edge 2 2 3 3   Control/stop worrying 1 2 3 3   Worry too much - different things 2 3 2 3   Trouble relaxing 1 2 2 3   Restless 1 2 3 3   Easily annoyed or irritable 2 2 2 3   Afraid - awful might happen 2 2 2 3   Total GAD 7 Score 11 15 17 21   Anxiety Difficulty Very difficult Very difficult Very difficult Extremely difficult    Suicidal/Homicidal: No  Therapist Response: Clinician utilized CBT, MI, Strengths Based, and Family systems techniques to address presenting depressive and anxious sxs.   Clinician actively engaged patient upon joining today's virtual visit, further engaging patient in introductory check-in, assessing presenting moods and affect, prompting reflections of events of recent weeks since previous visit and factors contributing to presenting moods.  Actively listened to patient's recounts of recent events, and sharing of things going well overall.  Actively engaged in reassessing presenting depressive and anxious symptoms via PHQ-9 and GAD-7, noting of reduction in anxious symptoms and mild increase in depressive symptoms, further supporting patient and processing  observed variances and symptoms. Utilized open-ended questions to further prompt patient's reflection of recent events, exploring patient's thoughts, feelings, and perspectives surrounding progression of [facility] IOP program, plans to begin academic program at [employer] [employer] [employer], and recent PCP visit and increased stress in relation to navigating which provided will maintain med man responsibilities.  Supported patient by connecting with psychiatrist and CMA and efforts to clarify psychiatrist's recommendation for care to transfer to PCP.  Clinician reassessed severity of sxs, and presence of any safety concerns. Clinician provided support and empathy to patient during session.   Plan: Return again in 2 weeks.  Diagnosis:  Encounter Diagnosis  Name Primary?   Bipolar I disorder (HCC) Yes     Collaboration of Care: Other none deemed necessary at this time.  Patient/Guardian was advised Release of Information must be obtained prior to any record release in order to collaborate their care with an outside provider. Patient/Guardian was advised if they have not already done so to contact the registration department to sign all necessary forms in order for us  to release information regarding their care.   Consent: Patient/Guardian gives verbal consent for treatment and assignment of benefits for services provided during this visit. Patient/Guardian expressed understanding and agreed to proceed.   Patsi Boots, MSW, LCSW 04/26/2024,  2:13 PM

## 2024-05-03 ENCOUNTER — Encounter (HOSPITAL_COMMUNITY): Payer: Self-pay

## 2024-05-04 ENCOUNTER — Other Ambulatory Visit (HOSPITAL_COMMUNITY): Payer: Self-pay | Admitting: *Deleted

## 2024-05-04 DIAGNOSIS — F1910 Other psychoactive substance abuse, uncomplicated: Secondary | ICD-10-CM

## 2024-05-04 DIAGNOSIS — F319 Bipolar disorder, unspecified: Secondary | ICD-10-CM

## 2024-05-04 DIAGNOSIS — F419 Anxiety disorder, unspecified: Secondary | ICD-10-CM

## 2024-05-04 MED ORDER — LAMOTRIGINE 200 MG PO TABS: 200.0000 mg | ORAL_TABLET | Freq: Every day | ORAL | 0 refills | Status: DC

## 2024-05-04 MED ORDER — QUETIAPINE FUMARATE 100 MG PO TABS: 100.0000 mg | ORAL_TABLET | Freq: Every day | ORAL | 0 refills | Status: DC

## 2024-05-17 ENCOUNTER — Ambulatory Visit (INDEPENDENT_AMBULATORY_CARE_PROVIDER_SITE_OTHER): Payer: MEDICAID | Admitting: Licensed Clinical Social Worker

## 2024-05-17 DIAGNOSIS — F319 Bipolar disorder, unspecified: Secondary | ICD-10-CM

## 2024-05-17 DIAGNOSIS — F1910 Other psychoactive substance abuse, uncomplicated: Secondary | ICD-10-CM | POA: Diagnosis not present

## 2024-05-17 NOTE — Progress Notes (Signed)
 THERAPIST PROGRESS NOTE   Session Date: 05/17/2024  Session Time:  1500 - 1542 Virtual Visit via Video Note  I connected with Jessica Marshall on 05/17/24 at  3:00 PM EDT by a video enabled telemedicine application and verified that I am speaking with the correct person using two identifiers.  Location: Patient: Home Provider: Home Office   I discussed the limitations of evaluation and management by telemedicine and the availability of in person appointments. The patient expressed understanding and agreed to proceed.   The patient was advised to call back or seek an in-person evaluation if the symptoms worsen or if the condition fails to improve as anticipated.  I provided 42 minutes of non-face-to-face time during this encounter.  Participation Level: Active  Behavioral Response: CasualAlertEuthymic  Type of Therapy: Individual Therapy  Treatment Goals addressed: Progressing (3) - Reduce frequency, intensity, and duration of depression symptoms so that daily functioning is improved (OP Depression) - Increase coping skills to manage depression and improve ability to perform daily activities (OP Depression) - Jessica Marshall will identify cognitive patterns and beliefs that support depression (OP Depression)  Not Met (add Reason) (1)  Initial (1) STG: Report a decrease in anxiety symptoms as evidenced by an overall reduction in anxiety score by a minimum of 25% on the Generalized Anxiety Disorder Scale (GAD-7) (Anxiety)  Completed/Met (4)  ProgressTowards Goals: Progressing  Interventions: CBT, Motivational Interviewing, Strength-based, and Family Systems  Summary: Jessica Marshall is a 43 y.o. female with past psych history of Bipolar, Anxiety, and polysubstance use, presenting for follow-up therapy session in efforts to improve management of depressive, anxious and mood d/o related symptoms.   Patient actively engaged in check-in, presenting in pleasant moods, and congruent affect throughout  visit. Pt openly engaged in introductory check-in, sharing of things having been going okay, sharing of having graduated from [facility] last Monday, having stepped down to aftercare tx/relapse prevention at this time. Further shared of having worried in weeks prior about running out of medication due to PCP having not confirmed intent to manage psychotropics. Reassessed anxious sxs via GAD-7, noting of increased sxs, sharing of having worried about medication, ongoing physical health issues surrounding believed pinched nerves in neck, and needing to start PT, further sharing of anxiousness depending on the day and who is around her. Reassessed depressive sxs via PHQ-9, noting reduction of sxs, sharing of having noticed improved sxs, getting out more, finishing [employer] classes. Reflected of feelings of accomplishments, sharing of feeling the family are beginning to trust her more with being sober for close to a year.   Patient responded well to interventions. Patient continues to meet criteria for Bipolar, Anxiety, and polysubstance use. Patient will continue to benefit from engagement in outpatient therapy due to being the least restrictive service to meet presenting needs.      05/17/2024    3:18 PM 04/26/2024    2:10 PM 04/12/2024   11:25 AM 02/23/2024    3:18 PM 12/17/2023   10:18 AM  Depression screen PHQ 2/9  Decreased Interest 2 2 2 3 3   Down, Depressed, Hopeless 1 1 2 2 3   PHQ - 2 Score 3 3 4 5 6   Altered sleeping 0 1 0 2 2  Tired, decreased energy 1 3 1 1 3   Change in appetite 0 0 0 1 2  Feeling bad or failure about yourself  1 1 1 2 3   Trouble concentrating 1 3 2 3 3   Moving slowly or fidgety/restless 0 0 1 0  2  Suicidal thoughts 0 0 0 0 0  PHQ-9 Score 6 11 9 14 21   Difficult doing work/chores Very difficult Very difficult Very difficult Very difficult Very difficult      05/17/2024    3:13 PM 04/26/2024    2:08 PM 04/12/2024   11:23 AM 02/23/2024    3:17 PM  GAD 7 : Generalized Anxiety Score   Nervous, Anxious, on Edge 2 2 2 3   Control/stop worrying 2 1 2 3   Worry too much - different things 3 2 3 2   Trouble relaxing 1 1 2 2   Restless 1 1 2 3   Easily annoyed or irritable 3 2 2 2   Afraid - awful might happen 1 2 2 2   Total GAD 7 Score 13 11 15 17   Anxiety Difficulty Very difficult Very difficult Very difficult Very difficult    Suicidal/Homicidal: No  Therapist Response: Clinician utilized CBT, MI, Strengths Based, and Family systems techniques to address presenting depressive and anxious sxs.   Clinician actively engaged patient upon joining today's virtual visit, engaging in introductory check-in, assessing presenting moods and affect, further prompting patient recounts of the events and factors contributing to presenting moods.  Actively listened to patient's recounts of events of the past 3 weeks, providing support and validation of patient's expressed thoughts and feelings surrounding presenting stressors and successes.  Utilized open-ended questions to support patient in further exploration of thoughts, feelings, and perspectives surrounding recent stressors and events.  Engage patient in reassessing presenting anxious and depressive symptoms via GAD-7 and PHQ-9, further processing increase in anxiousness over recent weeks and reduction in depressive symptoms.  Further supported patient in exploring individual feelings and perspectives in relation to completing [facility] IOP program, and approaching 1 year of sobriety.  Clinician reassessed severity of sxs, and presence of any safety concerns. Clinician provided support and empathy to patient during session.   Plan: Return again in 3 weeks.  Diagnosis:  Encounter Diagnoses  Name Primary?   Bipolar I disorder (HCC) Yes   Polysubstance abuse (HCC)     Collaboration of Care: Other none deemed necessary at this time.  Patient/Guardian was advised Release of Information must be obtained prior to any record release in order to  collaborate their care with an outside provider. Patient/Guardian was advised if they have not already done so to contact the registration department to sign all necessary forms in order for us  to release information regarding their care.   Consent: Patient/Guardian gives verbal consent for treatment and assignment of benefits for services provided during this visit. Patient/Guardian expressed understanding and agreed to proceed.   Lynwood JONETTA Maris, MSW, LCSW 05/17/2024,  3:20 PM

## 2024-06-05 ENCOUNTER — Encounter (HOSPITAL_COMMUNITY): Payer: Self-pay

## 2024-06-06 ENCOUNTER — Other Ambulatory Visit (HOSPITAL_COMMUNITY): Payer: Self-pay

## 2024-06-06 ENCOUNTER — Ambulatory Visit (INDEPENDENT_AMBULATORY_CARE_PROVIDER_SITE_OTHER): Payer: MEDICAID | Admitting: Licensed Clinical Social Worker

## 2024-06-06 DIAGNOSIS — F319 Bipolar disorder, unspecified: Secondary | ICD-10-CM

## 2024-06-06 DIAGNOSIS — F419 Anxiety disorder, unspecified: Secondary | ICD-10-CM

## 2024-06-06 DIAGNOSIS — F1910 Other psychoactive substance abuse, uncomplicated: Secondary | ICD-10-CM

## 2024-06-06 MED ORDER — QUETIAPINE FUMARATE 100 MG PO TABS
100.0000 mg | ORAL_TABLET | Freq: Every day | ORAL | 0 refills | Status: DC
Start: 2024-06-06 — End: 2024-06-16

## 2024-06-06 MED ORDER — LAMOTRIGINE 200 MG PO TABS: 200.0000 mg | ORAL_TABLET | Freq: Every day | ORAL | 0 refills | Status: DC

## 2024-06-06 NOTE — Progress Notes (Signed)
 THERAPIST PROGRESS NOTE   Session Date: 06/06/2024  Session Time:  1405 - 1444 Virtual Visit via Video Note  I connected with Jessica Marshall on 06/06/24 at  2:00 PM EDT by a video enabled telemedicine application and verified that I am speaking with the correct person using two identifiers.  Location: Patient: Home Provider: Home Office   I discussed the limitations of evaluation and management by telemedicine and the availability of in person appointments. The patient expressed understanding and agreed to proceed.   The patient was advised to call back or seek an in-person evaluation if the symptoms worsen or if the condition fails to improve as anticipated.  I provided 39 minutes of non-face-to-face time during this encounter.  Participation Level: Active  Behavioral Response: CasualAlertAnxious and Euthymic  Type of Therapy: Individual Therapy  Treatment Goals addressed: Progressing (3) - Reduce frequency, intensity, and duration of depression symptoms so that daily functioning is improved (OP Depression) - Increase coping skills to manage depression and improve ability to perform daily activities (OP Depression) - Jessica Marshall will identify cognitive patterns and beliefs that support depression (OP Depression)  Not Met (add Reason) (1)  Initial (1) STG: Report a decrease in anxiety symptoms as evidenced by an overall reduction in anxiety score by a minimum of 25% on the Generalized Anxiety Disorder Scale (GAD-7) (Anxiety)  Completed/Met (4)  ProgressTowards Goals: Progressing  Interventions: CBT, Motivational Interviewing, Strength-based, and Family Systems  Summary: Jessica Marshall is a 43 y.o. female with past psych history of Bipolar, Anxiety, and polysubstance use, presenting for follow-up therapy session in efforts to improve management of depressive, anxious and mood d/o related symptoms.   Patient actively engaged in check-in, presenting in primarily pleasant moods with mild  anxiousness surrounding recent events, and congruent affect throughout visit. Pt openly engaged in introductory check-in, sharing of It's been okay, further detailing It's been a week thought, detailing grandmother's recent medical challenges resulting in being in the hospital since Wednesday of last week, having originally presented for kidney stone procedure, and finding concerns related to Afib following procedure, sharing excitement for grandmother to return home this afternoon. Pt further shared of having fallen outside front porch due to wet mat being outside and slipping, hurting knee. Pt continues to attend [facility] [facility], recent probation appointment, and continued progress. Reassessed presenting depressive and anxious sxs via PHQ-9 and GAD-7, noting of minimal reduction in anx sxs, and minor increase in depressive sxs, noticing self being easily distracted and increased tiredness. Shared of continuing to take medication, going to [facility] program, and taking things a day at time, proving to improve abilities at managing sxs. Pt further shared of today marking 1 year sober, feeling accomplished and having celebrated with family last week.  Patient responded well to interventions. Patient continues to meet criteria for Bipolar, Anxiety, and polysubstance use. Patient will continue to benefit from engagement in outpatient therapy due to being the least restrictive service to meet presenting needs.      06/06/2024    2:25 PM 05/17/2024    3:18 PM 04/26/2024    2:10 PM 04/12/2024   11:25 AM 02/23/2024    3:18 PM  Depression screen PHQ 2/9  Decreased Interest 2 2 2 2 3   Down, Depressed, Hopeless 1 1 1 2 2   PHQ - 2 Score 3 3 3 4 5   Altered sleeping 0 0 1 0 2  Tired, decreased energy 2 1 3 1 1   Change in appetite 0 0 0 0 1  Feeling bad or failure about yourself  1 1 1 1 2   Trouble concentrating 2 1 3 2 3   Moving slowly or fidgety/restless 0 0 0 1 0  Suicidal thoughts 0 0 0 0 0  PHQ-9 Score 8 6 11 9 14    Difficult doing work/chores Somewhat difficult Very difficult Very difficult Very difficult Very difficult      06/06/2024    2:20 PM 05/17/2024    3:13 PM 04/26/2024    2:08 PM 04/12/2024   11:23 AM  GAD 7 : Generalized Anxiety Score  Nervous, Anxious, on Edge 2 2 2 2   Control/stop worrying 2 2 1 2   Worry too much - different things 2 3 2 3   Trouble relaxing 1 1 1 2   Restless 1 1 1 2   Easily annoyed or irritable 1 3 2 2   Afraid - awful might happen 3 1 2 2   Total GAD 7 Score 12 13 11 15   Anxiety Difficulty Somewhat difficult Very difficult Very difficult Very difficult    Suicidal/Homicidal: No  Therapist Response: Clinician utilized CBT, MI, Strengths Based, and Family systems techniques to address presenting depressive and anxious sxs.   Clinician actively greeted patient upon joining today's virtual visit, assessing presenting moods and affect, and engaging in introductory check-in. Actively listened to patient's recounts of the past 3 weeks since previous visit, providing support and validation for expressed thoughts and feelings surrounding noted stressors and challenges, utilizing open-ended questions to further prompt patient's reflection of individual efforts at managing recent increased stress. Reassess presenting depressive and anxious symptoms via PHQ-9 and GAD-7, noting of minor increase in depressive symptoms, maintaining mild range, and minimal reduction in anxious symptoms of maintaining moderate range.  Further prompted patient to reflect on individual efforts at managing symptoms. Provided support and encouragement for patient's identified success surrounding reaching 1 year of sobriety today.  Clinician reassessed severity of sxs, and presence of any safety concerns. Clinician provided support and empathy to patient during session.   Plan: Return again in 3 weeks.  Diagnosis:  Encounter Diagnoses  Name Primary?   Bipolar I disorder (HCC) Yes   Anxiety       Collaboration of Care: Other none deemed necessary at this time.  Patient/Guardian was advised Release of Information must be obtained prior to any record release in order to collaborate their care with an outside provider. Patient/Guardian was advised if they have not already done so to contact the registration department to sign all necessary forms in order for us  to release information regarding their care.   Consent: Patient/Guardian gives verbal consent for treatment and assignment of benefits for services provided during this visit. Patient/Guardian expressed understanding and agreed to proceed.   Jessica Marshall, MSW, LCSW 06/06/2024,  2:28 PM

## 2024-06-07 ENCOUNTER — Ambulatory Visit (HOSPITAL_COMMUNITY): Payer: MEDICAID | Admitting: Licensed Clinical Social Worker

## 2024-06-16 ENCOUNTER — Ambulatory Visit (HOSPITAL_BASED_OUTPATIENT_CLINIC_OR_DEPARTMENT_OTHER): Payer: MEDICAID | Admitting: Psychiatry

## 2024-06-16 ENCOUNTER — Other Ambulatory Visit: Payer: Self-pay

## 2024-06-16 ENCOUNTER — Encounter (HOSPITAL_COMMUNITY): Payer: Self-pay | Admitting: Psychiatry

## 2024-06-16 VITALS — BP 113/76 | HR 132 | Ht 67.0 in | Wt 202.0 lb

## 2024-06-16 DIAGNOSIS — F319 Bipolar disorder, unspecified: Secondary | ICD-10-CM

## 2024-06-16 DIAGNOSIS — F1910 Other psychoactive substance abuse, uncomplicated: Secondary | ICD-10-CM | POA: Diagnosis not present

## 2024-06-16 DIAGNOSIS — F419 Anxiety disorder, unspecified: Secondary | ICD-10-CM

## 2024-06-16 MED ORDER — QUETIAPINE FUMARATE 100 MG PO TABS: 100.0000 mg | ORAL_TABLET | Freq: Every day | ORAL | 2 refills | Status: DC

## 2024-06-16 MED ORDER — LAMOTRIGINE 200 MG PO TABS: 200.0000 mg | ORAL_TABLET | Freq: Every day | ORAL | 2 refills | Status: DC
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
## 2024-06-22 ENCOUNTER — Ambulatory Visit (INDEPENDENT_AMBULATORY_CARE_PROVIDER_SITE_OTHER): Payer: MEDICAID | Admitting: Licensed Clinical Social Worker

## 2024-06-22 DIAGNOSIS — F1910 Other psychoactive substance abuse, uncomplicated: Secondary | ICD-10-CM

## 2024-06-22 DIAGNOSIS — F319 Bipolar disorder, unspecified: Secondary | ICD-10-CM | POA: Diagnosis not present

## 2024-06-22 NOTE — Progress Notes (Signed)
 THERAPIST PROGRESS NOTE   Session Date: 06/22/2024  Session Time:  1505 - 1535 Virtual Visit via Video Note  I connected with Jessica Marshall on 06/22/24 at  3:00 PM EDT by a video enabled telemedicine application and verified that I am speaking with the correct person using two identifiers.  Location: Patient: Home Provider: Home Office   I discussed the limitations of evaluation and management by telemedicine and the availability of in person appointments. The patient expressed understanding and agreed to proceed.   The patient was advised to call back or seek an in-person evaluation if the symptoms worsen or if the condition fails to improve as anticipated.  I provided 30 minutes of non-face-to-face time during this encounter.  Participation Level: Active  Behavioral Response: CasualAlertAnxious and Euthymic  Type of Therapy: Individual Therapy  Treatment Goals addressed: Progressing (3) - Reduce frequency, intensity, and duration of depression symptoms so that daily functioning is improved (OP Depression) - Increase coping skills to manage depression and improve ability to perform daily activities (OP Depression) - Jessica Marshall will identify cognitive patterns and beliefs that support depression (OP Depression)  Not Met (add Reason) (1)  Initial (1) STG: Report a decrease in anxiety symptoms as evidenced by an overall reduction in anxiety score by a minimum of 25% on the Generalized Anxiety Disorder Scale (GAD-7) (Anxiety)  Completed/Met (4)  ProgressTowards Goals: Progressing  Interventions: CBT, Motivational Interviewing, Strength-based, and Family Systems  Summary: Jessica Marshall is a 43 y.o. female with past psych history of Bipolar, Anxiety, and polysubstance use, presenting for follow-up therapy session in efforts to improve management of depressive, anxious and mood d/o related symptoms.   Patient actively engaged in check-in, presenting in primarily pleasant moods with mild  anxiousness surrounding recent events, and congruent affect throughout visit. Pt openly engaged in introductory check-in, sharing of Doing good, further detailing things going well, having gone to meeting with [facility] this morning, sharing of [facility] services going well. Revisited prior stress surrounding grandmothers prior surgery, noting of reduced stress as grandmother had follow up procedure within the last week and appears to be progressing well. Reviewed recent med man visit with Arfeen, MD, noting of maintained regimen and follow up in 71mo. Shared of starting school in approx. 3 weeks with plans to obtain books the week prior. No significant stress or challenges with anxiety or depression over recent weeks. Reassessed depressive and anxious sxs via PHQ-9 and GAD-9, noting of significant reduction in sxs over recent weeks. Processed pt's individual efforts at managing stress, noting of having been journaling to help self reflect on thoughts, feelings, and moods, noting of mother having been primary stressor noted previously, finding self having improved perspective and increased abilities at managing stress.  Patient responded well to interventions. Patient continues to meet criteria for Bipolar, Anxiety, and polysubstance use. Patient will continue to benefit from engagement in outpatient therapy due to being the least restrictive service to meet presenting needs.      06/22/2024    3:22 PM 06/06/2024    2:25 PM 05/17/2024    3:18 PM 04/26/2024    2:10 PM 04/12/2024   11:25 AM  Depression screen PHQ 2/9  Decreased Interest 1 2 2 2 2   Down, Depressed, Hopeless 1 1 1 1 2   PHQ - 2 Score 2 3 3 3 4   Altered sleeping 0 0 0 1 0  Tired, decreased energy 1 2 1 3 1   Change in appetite 0 0 0 0 0  Feeling bad  or failure about yourself  1 1 1 1 1   Trouble concentrating 1 2 1 3 2   Moving slowly or fidgety/restless 0 0 0 0 1  Suicidal thoughts 0 0 0 0 0  PHQ-9 Score 5 8 6 11 9   Difficult doing work/chores  Somewhat difficult Somewhat difficult Very difficult Very difficult Very difficult      06/22/2024    3:25 PM 06/06/2024    2:20 PM 05/17/2024    3:13 PM 04/26/2024    2:08 PM  GAD 7 : Generalized Anxiety Score  Nervous, Anxious, on Edge 1 2 2 2   Control/stop worrying 1 2 2 1   Worry too much - different things 1 2 3 2   Trouble relaxing 1 1 1 1   Restless 0 1 1 1   Easily annoyed or irritable 1 1 3 2   Afraid - awful might happen 1 3 1 2   Total GAD 7 Score 6 12 13 11   Anxiety Difficulty Somewhat difficult Somewhat difficult Very difficult Very difficult    Suicidal/Homicidal: No  Therapist Response: Clinician utilized CBT, MI, Strengths Based, and Family systems techniques to address presenting depressive and anxious sxs.   Clinician actively greeted patient upon joining today's virtual visit, assessing presenting moods and affect. Openly engaged pt in introductory check-in, prompting pt's recounts of how things have been going over the past two weeks since previous visit, eliciting pt's recounts of any presenting stressors or concerns. Actively listened to pt's recounts of events, utilizing open ended questions in further exploration of thoughts, feelings, and perspectives surrounding progression of grandmother's physical health, individual continued involvement in [facility], and individual efforts at managing reduction of stress.  Reassessed presenting depressive and anxious sxs via PHQ-9 and GAD-7, supporting pt in processing significant reductions in sxs, actively exploring improved individual efforts at managing stress and improvements in moods.  Clinician reassessed severity of sxs, and presence of any safety concerns. Clinician provided support and empathy to patient during session.   Homework: Continue utilization of journaling to aid in reflection of thoughts, feelings, and moods, and support in adjusting perspectives.  Plan: Return again in 3 weeks.  Diagnosis:  Encounter Diagnoses   Name Primary?   Bipolar I disorder (HCC) Yes   Polysubstance abuse (HCC)      Collaboration of Care: Other none deemed necessary at this time.  Patient/Guardian was advised Release of Information must be obtained prior to any record release in order to collaborate their care with an outside provider. Patient/Guardian was advised if they have not already done so to contact the registration department to sign all necessary forms in order for us  to release information regarding their care.   Consent: Patient/Guardian gives verbal consent for treatment and assignment of benefits for services provided during this visit. Patient/Guardian expressed understanding and agreed to proceed.   Lynwood JONETTA Maris, MSW, LCSW 06/22/2024,  3:27 PM

## 2024-06-28 ENCOUNTER — Ambulatory Visit (HOSPITAL_COMMUNITY): Payer: MEDICAID | Admitting: Licensed Clinical Social Worker

## 2024-07-11 ENCOUNTER — Ambulatory Visit (INDEPENDENT_AMBULATORY_CARE_PROVIDER_SITE_OTHER): Payer: MEDICAID | Admitting: Licensed Clinical Social Worker

## 2024-07-11 ENCOUNTER — Encounter (HOSPITAL_COMMUNITY): Payer: Self-pay

## 2024-07-11 DIAGNOSIS — Z91199 Patient's noncompliance with other medical treatment and regimen due to unspecified reason: Secondary | ICD-10-CM

## 2024-07-11 NOTE — Progress Notes (Signed)
 THERAPIST PROGRESS NOTE   Session Date: 07/11/2024  Session Time: 1500   Elenor DELENA Sable    Clinician attempted to connect with patient for scheduled appointment via Careg[facility] video, sending text request x3 with no response.     Attempt 1: Text: 1507   Attempt 2: Text: 1509   Attempt 3: Text: 1510   Disconnected video visit at:  1512     Per [facility] policy, after multiple attempts to reach patient unsuccessfully at appointed time, visit will be coded as a no show.  Lynwood JONETTA Maris, MSW, LCSW 07/11/2024,  3:12 PM

## 2024-08-15 ENCOUNTER — Ambulatory Visit (INDEPENDENT_AMBULATORY_CARE_PROVIDER_SITE_OTHER): Payer: MEDICAID | Admitting: Licensed Clinical Social Worker

## 2024-08-15 DIAGNOSIS — F319 Bipolar disorder, unspecified: Secondary | ICD-10-CM

## 2024-08-15 DIAGNOSIS — F419 Anxiety disorder, unspecified: Secondary | ICD-10-CM

## 2024-08-15 NOTE — Progress Notes (Signed)
 THERAPIST PROGRESS NOTE   Session Date: 08/15/2024  Session Time:  1504 - 1543 Virtual Visit via Video Note  I connected with Jessica Marshall on 08/15/24 at  3:00 PM EDT by a video enabled telemedicine application and verified that I am speaking with the correct person using two identifiers.  Location: Patient: Home Provider: Home Office   I discussed the limitations of evaluation and management by telemedicine and the availability of in person appointments. The patient expressed understanding and agreed to proceed.   The patient was advised to call back or seek an in-person evaluation if the symptoms worsen or if the condition fails to improve as anticipated.  I provided 39 minutes of non-face-to-face time during this encounter.  Participation Level: Active  Behavioral Response: CasualAlertAnxious and Euthymic  Type of Therapy: Individual Therapy  Treatment Goals addressed:  Progressing (4) LTG: Reduce frequency, intensity, and duration of depression symptoms so that daily functioning is improved (OP Depression) LTG: Increase coping skills to manage depression and improve ability to perform daily activities (OP Depression) STG: Jessica Marshall will identify cognitive patterns and beliefs that support depression (OP Depression) STG: Report a decrease in anxiety symptoms as evidenced by an overall reduction in anxiety score by a minimum of 25% on the Generalized Anxiety Disorder Scale (GAD-7) (Anxiety)  Completed/Met (5)  ProgressTowards Goals: Progressing  Interventions: CBT, Motivational Interviewing, Strength-based, and Family Systems  Summary: Jessica Marshall is a 43 y.o. female with past psych history of Bipolar, Anxiety, and polysubstance use, presenting for follow-up therapy session in efforts to improve management of depressive, anxious and mood d/o related symptoms.   Patient actively engaged in check-in, presenting in primarily pleasant moods with mild anxiousness, and congruent affect  throughout visit. Pt openly engaged in introductory check-in, sharing of Doing okay, going to school and [facility], further sharing of finding group to still be beneficial and potential to finish probation early. Pt shared of things going well at home, continuing to support grandmother in the home. Pt details of how things are going for her since returning to school last month, sharing of feeling to be doing well. Reassessed presenting sxs via GAD-7 and PHQ-9, processing observed sxs, noting of continued downward trends in sxs, sharing of minor irritability solely with mother surrounding her attitude towards pt. Engaged in review of progression towards individualized tx goals outlined in tx plan, processing continued progression towards goals. Pt shared of not proving to experience any increased anxiousness or depressive sxs over recent months, noting of things proving to go well with recent transitions, keeping busy with school, and exploring interest in seeking out sober supports/friends.  Patient responded well to interventions. Patient continues to meet criteria for Bipolar, Anxiety, and polysubstance use. Patient will continue to benefit from engagement in outpatient therapy due to being the least restrictive service to meet presenting needs.      08/15/2024    3:24 PM 06/22/2024    3:22 PM 06/06/2024    2:25 PM 05/17/2024    3:18 PM 04/26/2024    2:10 PM  Depression screen PHQ 2/9  Decreased Interest 1 1 2 2 2   Down, Depressed, Hopeless 1 1 1 1 1   PHQ - 2 Score 2 2 3 3 3   Altered sleeping 0 0 0 0 1  Tired, decreased energy 1 1 2 1 3   Change in appetite 0 0 0 0 0  Feeling bad or failure about yourself  0 1 1 1 1   Trouble concentrating 1 1 2 1  3  Moving slowly or fidgety/restless 0 0 0 0 0  Suicidal thoughts 0 0 0 0 0  PHQ-9 Score 4 5 8 6 11   Difficult doing work/chores Somewhat difficult Somewhat difficult Somewhat difficult Very difficult Very difficult      08/15/2024    3:14 PM 06/22/2024     3:25 PM 06/06/2024    2:20 PM 05/17/2024    3:13 PM  GAD 7 : Generalized Anxiety Score  Nervous, Anxious, on Edge 1 1 2 2   Control/stop worrying 1 1 2 2   Worry too much - different things 1 1 2 3   Trouble relaxing 0 1 1 1   Restless 0 0 1 1  Easily annoyed or irritable 1 1 1 3   Afraid - awful might happen 1 1 3 1   Total GAD 7 Score 5 6 12 13   Anxiety Difficulty Somewhat difficult Somewhat difficult Somewhat difficult Very difficult    Suicidal/Homicidal: No  Therapist Response: Clinician utilized CBT, MI, Strengths Based, and Family systems techniques to address presenting depressive and anxious sxs.   Clinician actively greeted patient upon joining today's virtual visit, assessing presenting moods and affect. Engaged pt in introductory check-in, utilizing open ended questions in eliciting pt's recounts of events of the past two months, and continued improvements in management of presenting stressors and challenges. Utilized active listening techniques to provide support and empathy to pt in recounts of recent events, and continued observed progressions. Reassessed presenting depressive and anxious sxs via PHQ-9 and GAD-7, engaging in processing maintained reductions in sxs, engaging in review of individualized tx goals and maintained progress. Pt proves to maintain consistent progression towards identified goals.  Homework: None.   Plan: Return again in 4 weeks.  Diagnosis:  Encounter Diagnoses  Name Primary?   Bipolar I disorder (HCC) Yes   Anxiety       Collaboration of Care: Other none deemed necessary at this time.  Patient/Guardian was advised Release of Information must be obtained prior to any record release in order to collaborate their care with an outside provider. Patient/Guardian was advised if they have not already done so to contact the registration department to sign all necessary forms in order for us  to release information regarding their care.   Consent:  Patient/Guardian gives verbal consent for treatment and assignment of benefits for services provided during this visit. Patient/Guardian expressed understanding and agreed to proceed.   Jessica Marshall, MSW, LCSW 08/15/2024,  3:26 PM

## 2024-09-12 ENCOUNTER — Ambulatory Visit (INDEPENDENT_AMBULATORY_CARE_PROVIDER_SITE_OTHER): Payer: MEDICAID | Admitting: Licensed Clinical Social Worker

## 2024-09-12 DIAGNOSIS — F319 Bipolar disorder, unspecified: Secondary | ICD-10-CM | POA: Diagnosis not present

## 2024-09-12 DIAGNOSIS — F419 Anxiety disorder, unspecified: Secondary | ICD-10-CM

## 2024-09-12 NOTE — Progress Notes (Unsigned)
 THERAPIST PROGRESS NOTE   Session Date: 09/12/2024  Session Time:  1505 - 1540 Virtual Visit via Video Note  I connected with Jessica Marshall on 09/12/24 at  3:00 PM EDT by a video enabled telemedicine application and verified that I am speaking with the correct person using two identifiers.  Location: Patient: Home Provider: Home Office   I discussed the limitations of evaluation and management by telemedicine and the availability of in person appointments. The patient expressed understanding and agreed to proceed.   The patient was advised to call back or seek an in-person evaluation if the symptoms worsen or if the condition fails to improve as anticipated.  I provided 35 minutes of non-face-to-face time during this encounter.  Participation Level: Active  Behavioral Response: CasualAlertAnxious and Euthymic  Type of Therapy: Individual Therapy  Treatment Goals addressed:  Progressing (4) LTG: Reduce frequency, intensity, and duration of depression symptoms so that daily functioning is improved (OP Depression) LTG: Increase coping skills to manage depression and improve ability to perform daily activities (OP Depression) STG: Wyolene will identify cognitive patterns and beliefs that support depression (OP Depression) STG: Report a decrease in anxiety symptoms as evidenced by an overall reduction in anxiety score by a minimum of 25% on the Generalized Anxiety Disorder Scale (GAD-7) (Anxiety)  Completed/Met (5)  ProgressTowards Goals: Progressing  Interventions: CBT, Motivational Interviewing, Strength-based, and Family Systems  Summary: Jessica Marshall is a 43 y.o. female with past psych history of Bipolar, Anxiety, and polysubstance use, presenting for follow-up therapy session in efforts to improve management of depressive, anxious and mood d/o related symptoms.   Patient actively engaged in check-in, presenting in primarily pleasant moods with mild anxiousness, and congruent  affect throughout visit. Pt openly engaged in introductory check-in, sharing of Doing good, further sharing of having been attending [employer] regularly, actively working on academic needs, and meeting with probation to ensure meeting all requirements.Pt shared of having learned at recent dentist visit of needing to have all teeth removed due to severe infections and broken teeth. Reassessed presenting anxious and depressive sxs via PHQ-9 and GAD-7, further exploring variances in screening scores, and reflecting on progressions in reduction in screening scores over the past yr. Pt shared details surrounding current schooling requirements, having started IT courses. Further processed stress surrounding mother's continued engagement in unhealthy romantic relationship and how this proves to impact relationship between pt and mother, processing how pt has proven to manage stress over recent weeks, by actively working to avoid interactions and eliminate potential contact with individual. Pt shared of experiencing minimal stress overall, proving to maintain moods and reduction in sxs well through maintaining consistency with daily structure and routine.     09/12/2024    3:17 PM 08/15/2024    3:24 PM 06/22/2024    3:22 PM 06/06/2024    2:25 PM 05/17/2024    3:18 PM  Depression screen PHQ 2/9  Decreased Interest 1 1 1 2 2   Down, Depressed, Hopeless 0 1 1 1 1   PHQ - 2 Score 1 2 2 3 3   Altered sleeping 0 0 0 0 0  Tired, decreased energy 1 1 1 2 1   Change in appetite 0 0 0 0 0  Feeling bad or failure about yourself  0 0 1 1 1   Trouble concentrating 1 1 1 2 1   Moving slowly or fidgety/restless 0 0 0 0 0  Suicidal thoughts 0 0 0 0 0  PHQ-9 Score 3 4 5 8 6   Difficult  doing work/chores Somewhat difficult Somewhat difficult Somewhat difficult Somewhat difficult Very difficult      09/12/2024    3:15 PM 08/15/2024    3:14 PM 06/22/2024    3:25 PM 06/06/2024    2:20 PM  GAD 7 : Generalized Anxiety Score  Nervous,  Anxious, on Edge 1 1 1 2   Control/stop worrying 1 1 1 2   Worry too much - different things 1 1 1 2   Trouble relaxing 0 0 1 1  Restless 0 0 0 1  Easily annoyed or irritable 1 1 1 1   Afraid - awful might happen 1 1 1 3   Total GAD 7 Score 5 5 6 12   Anxiety Difficulty Somewhat difficult Somewhat difficult Somewhat difficult Somewhat difficult    Suicidal/Homicidal: No  Therapist Response:  Clinician actively greeted patient upon joining today's virtual visit, assessing presenting moods and affect. Engaged pt in introductory check-in, exploring daily events and presenting moods. Utilized open ended questions in eliciting recounts of events of the past month, exploring presence of newly identified/observed stressors, ongoing stressors, implications on moods, and individual efforts in managing challenges.  Utilized active listening techniques to provide support and validation of recounts of events and recent stress, and continued observed progressions.  Reassessed presenting depressive and anxious sxs via PHQ-9 and GAD-7, engaging in processing maintained reductions in sxs. Clinician utilized CBT, MI, Strengths Based, and psycho-ed in supporting pt's navigation of present challenges and stress.    [x]  Cognitive Challenging []  Cognitive Refocusing []  Cognitive Reframing  []  Communication Skills []  Compliance Issues []  DBT [x]  Exploration of Coping Patterns []  Exploration of Emotions [x]  Exploration of Relationship Patterns []  Guided Imagery []  Interactive Feedback [x]  Interpersonal Resolutions []  Mindfulness Training []  Preventative Services [x]  Psycho-Education  []  Relaxation/Deep Breathing []  Review of Treatment Plan/Progress []  Role-Play/Behavioral Rehearsal  [x]  Structured Problem Solving [x]  Supportive Reflection [x]  Symptom Management  []  Other  Patient responded well to interventions. Patient continues to meet criteria for Bipolar, Anxiety, and polysubstance use. Patient will continue  to benefit from engagement in outpatient therapy due to being the least restrictive service to meet presenting needs. Pt proves to maintain consistent progression towards identified goals.  Homework: None.   Plan: Return again in 4 weeks.  Diagnosis:  Encounter Diagnoses  Name Primary?   Bipolar I disorder (HCC) Yes   Anxiety      Collaboration of Care: Other none deemed necessary at this time.  Patient/Guardian was advised Release of Information must be obtained prior to any record release in order to collaborate their care with an outside provider. Patient/Guardian was advised if they have not already done so to contact the registration department to sign all necessary forms in order for us  to release information regarding their care.   Consent: Patient/Guardian gives verbal consent for treatment and assignment of benefits for services provided during this visit. Patient/Guardian expressed understanding and agreed to proceed.   Lynwood JONETTA Maris, MSW, LCSW 09/12/2024,  3:19 PM

## 2024-09-19 NOTE — Progress Notes (Signed)
 [address] - AMBULATORY [id number] INTERNAL MEDICINE - [facility] [address] [place] [facility] [id number]  09/19/24  Subjective   History of Present Illness The patient is a 43 year old female who presents today for a follow-up visit. Her medical history includes anxiety with panic attacks, bipolar 1 disorder, depression, GERD, migraines, cervicalgia, and tobacco use.   Her last visit was 5 months ago, during which she reported neck pain and sought further evaluation for carpal tunnel syndrome by an external provider. She was referred to orthopedics for further assessment. She initially saw Dr. Chrisandra, a physical medicine and rehabilitation physician, on 05/12/2024, where she was prescribed Flexeril and a Medrol Dosepak, and referred to physical therapy. Approximately 2 months later, she was seen again with unchanged symptoms, and her Flexeril prescription was refilled. A cervical MRI revealed moderate spinal canal stenosis and severe neuroforaminal stenosis bilaterally.  She reports no current health concerns and is generally doing well. She has not yet scheduled an eye appointment but plans to do so. She is not experiencing any chest pain or pressure. She is currently not working and is attending school full-time for [employer] for IT, which she started in August. She expects to complete her studies by December 2026.  Her last headache occurred last week, lasting approximately 30 minutes. She does not have any sumatriptan and requests a prescription for it as it typically alleviates her migraines. She has been managing her headaches with Tylenol  or ibuprofen , which provide some relief after a while. Sumatriptan does not induce sleepiness in her.  She has completed the necessary dental impressions for dentures and is awaiting insurance approval. She reports no difficulty in chewing food.  Her heartburn symptoms are currently under control, but she is interested in increasing her famotidine dosage  to 40 mg.  She is taking Klonopin 0.5 mg at bedtime but occasionally requires a second dose due to intermittent awakenings at night. She has not undergone a sleep study. She wakes up at night to urinate. She has 2 small dogs at home but does not hear them because her son takes care of them and she keeps her TV volume low. She also does not hear her neighbors. She typically falls asleep while watching TV.  She continues to take Seroquel  and lamotrigine  and has an upcoming appointment with her doctor on Thursday morning.  Education Level: Currently attending technical college Occupation: Physicist, medical Tobacco: Smokes cigarettes Sleep: Reports intermittent awakenings at night, typically falls asleep while watching TV Living Condition: Lives with her son and has 2 small dogs  FAMILY HISTORY Her mother has sleep apnea and uses a machine for it.   Review of Systems:  Review of Systems  Constitutional:  Negative for chills, fatigue and fever.  Eyes:  Negative for visual disturbance.  Respiratory:  Negative for shortness of breath.   Cardiovascular:  Negative for chest pain.  Gastrointestinal:  Negative for constipation and diarrhea.  Musculoskeletal:  Positive for neck pain.  Neurological:  Positive for headaches. Negative for dizziness.  Psychiatric/Behavioral:  Positive for dysphoric mood and sleep disturbance. Negative for self-injury and suicidal ideas. The patient is nervous/anxious.      The following portions of the patient's history were reviewed and updated as appropriate: allergies, current medications, past family history, past medical history, past social history, past surgical history and problem list.  Past medical history: Medical History[1]   Medications:  Current Medications[2]   Allergies:  Allergies[3]   Social:  Social History   Socioeconomic History  .  Marital status: Single    Spouse name: Not on file  . Number of children: Not on file  . Years of  education: Not on file  . Highest education level: Not on file  Occupational History  . Not on file  Tobacco Use  . Smoking status: Every Day    Current packs/day: 0.50    Average packs/day: 1 pack/day for 26.8 years (26.3 ttl pk-yrs)    Types: Cigarettes    Start date: 1999  . Smokeless tobacco: Former  Substance and Sexual Activity  . Alcohol use: Not Currently  . Drug use: Not Currently  . Sexual activity: Not Currently  Other Topics Concern  . Not on file  Social History Narrative  . Not on file   Social Drivers of Health   Food Insecurity: Low Risk  (09/22/2023)   Food vital sign   . Within the past 12 months, you worried that your food would run out before you got money to buy more: Never true   . Within the past 12 months, the food you bought just didn't last and you didn't have money to get more: Never true  Transportation Needs: No Transportation Needs (09/22/2023)   Transportation   . In the past 12 months, has lack of reliable transportation kept you from medical appointments, meetings, work or from getting things needed for daily living? : No  Safety: Low Risk  (11/27/2023)   Safety   . How often does anyone, including family and friends, physically hurt you?: Never   . How often does anyone, including family and friends, insult or talk down to you?: Never   . How often does anyone, including family and friends, threaten you with harm?: Never   . How often does anyone, including family and friends, scream or curse at you?: Never  Living Situation: Low Risk  (09/22/2023)   Living Situation   . What is your living situation today?: I have a steady place to live   . Think about the place you live. Do you have problems with any of the following? Choose all that apply:: None/None on this list     Surgical:  Surgical History[4]      Objective   BP 118/86   Pulse 100   Temp 97.4 F (36.3 C) (Temporal)   Ht 1.727 m (5' 8)   Wt 90.7 kg (200 lb)   SpO2 100%    BMI 30.41 kg/m   Wt Readings from Last 3 Encounters:  09/19/24 90.7 kg (200 lb)  07/14/24 87.5 kg (193 lb)  05/12/24 87.5 kg (193 lb)    BP Readings from Last 3 Encounters:  09/19/24 118/86  07/14/24 122/86  05/12/24 112/66     Physical Exam:  Physical Exam  Physical Exam General Appearance: Well groomed, appropriately dressed, no acute distress. Vital signs: Reviewed, Within normal limits. HEENT: Normocephalic, atraumatic, hearing normal, extraocular movements intact, conjunctiva clear, nares patent, lips pink, moist, neck supple, no thyromegaly. Dentition abnor Respiratory: Clear to auscultation bilaterally, effort normal. Cardiovascular: Regular rate and rhythm, no murmurs, radial pulse 2+ bilaterally. Back, Musculoskeletal: Limited lateral range of motion of the cervical spine, no upper extremity weakness, grip strength 5 out of 5 bilaterally, full range of motion in all extremities. Extremities: Lower extremities without edema. Skin: Warm and dry, no rash, lesion of concern, capillary refill less than 2 seconds. Neurological: Alert and oriented x3, gait intact. Psychiatric: Attentive, normal affect, normal condition memory.   Relevant portions of the electronic  records were reviewed. Recent and/or relevant labs and imaging are listed below.    Recent Labs:  CrCl cannot be calculated (Patient's most recent lab result is older than the maximum 10 days allowed.).  Lab Results  Component Value Date   WBC 6.80 09/22/2023   HGB 14.8 09/22/2023   HCT 42.2 09/22/2023   PLT 221 09/22/2023   CHOL 188 09/22/2023   TRIG 108 09/22/2023   HDL 50 (L) 09/22/2023   ALT 8 09/22/2023   AST 14 09/22/2023   NA 138 09/22/2023   K 4.6 09/22/2023   CL 105 09/22/2023   CREATININE 0.89 09/22/2023   BUN 15 09/22/2023   CO2 27 09/22/2023   TSH 1.084 09/22/2023     Recent Imaging:  MRI Spine Cervical WO Contrast Narrative: MRI CERVICAL SPINE WITHOUT CONTRAST, 07/29/2024 5:45  PM  INDICATION: Neck pain, chronic, degenerative changes on xray, Radiculopathy, cervical region \ M54.12 Radiculopathy, cervical region   COMPARISON: Cervical spine radiograph 05/12/2024  TECHNIQUE: Multiplanar, multisequence surface-coil magnetic resonance imaging of the cervical spine was performed without contrast.    LEVELS IMAGED: Foramen magnum to upper thoracic region.  LIMITATION: The quality or scope of this examination is mild to moderately degraded because of patient-related (motion, artifacts, positioning) or technical factors beyond our control. Although small or subtle lesions could conceivably be obscured, overall image quality is thought to be sufficient for the diagnostic purposes intended. If unanswered clinical questions remain, please contact the radiology service for recommendations about rescheduling this patient or for selecting an alternative imaging strategy.  FINDINGS: Alignment: Slight anterolisthesis of C7 on T1.  Vertebrae: Vertebral body heights are maintained. No marrow signal abnormalities to suggest neoplasm. Modic type I changes at C6-C7. Modic type II changes at C5-C6 and C6-C7.  Spinal cord: Nondiagnostic due to motion.  Craniocervical junction: No focal abnormality.  Degenerative changes: C2-C3: No substantial canal or foraminal stenosis.  C3-C4: Tiny posterior disc osteophyte complex without substantial canal or foraminal stenosis. C4-C5: Posterior disc osteophyte complex contributes to mild spinal canal stenosis. Right-sided uncovertebral joint spurring contributes to mild right neural foraminal stenosis. C5-C6: Posterior disc osteophyte complex and ligamentum flavum thickening contribute to moderate spinal canal stenosis with flattening of the ventral cord contour. There is severe left neural foraminal stenosis secondary to a combination of bulky uncovertebral joint osteophytes and possibly a component of herniated disc material. Neural foraminal  stenosis is mild to moderate on the right. C6-C7: Posterior disc osteophyte complex and ligamentum flavum thickening contribute to moderate spinal canal stenosis with slight flattening of the ventral cord contour. Facet and uncovertebral joint spurring contribute to moderate bilateral neural foraminal stenosis. C7-T1: Slight anterolisthesis, small disc bulge, and degenerative facet hypertrophy contribute to moderate left neural foraminal stenosis. No substantial spinal canal stenosis.  Visualized portion of the thoracic spine: No high grade canal stenosis. Impression: Motion degraded study. Within this limitation:  1.  Moderate spinal canal stenosis at C5-C6 and C6-C7. 2.  Severe neural foraminal stenosis on the left at C5-C6. 3.  Moderate neural foraminal stenosis on the right at C5-C6, bilaterally at C6-C7, and on the left at C7-T1. 4.  Exam is essentially nondiagnostic for the evaluation of potential spinal cord signal abnormality.    Assessment/Plan   Assessment & Plan 1. Anxiety: - Symptoms are typically controlled with Klonopin 0.5 mg nightly. GAD-7 total score is 6.  - She is tolerating Lexapro 10 mg daily well. PHQ total score is 9. - Continue current regimen. It is okay  to take 2 tablets if needed of Klonopin 0.5 mg nightly. - PDMP reviewed. Consider OSA screening  2. Bipolar depression: - She reports no recent episodes of mania or hypomania and no hallucinations. - Encouraged to follow up with an external psychiatrist for evaluation and management. - Continue current medication regimen of Seroquel  100 mg nightly and Lamictal  25 mg daily.  3. Gastroesophageal reflux disease: - Symptoms are typically well controlled with famotidine 20 mg but desires dose escalation. - Famotidine 40 mg daily sent to the pharmacy.  4. Recurrent occipital headache: - Headaches have been infrequent recently, with the last episode lasting approximately 30 minutes and managed with over-the-counter  oral analgesics. - Requesting Imitrex refill 100 mg as needed sent.  5. Cervical spinal stenosis: - Advised to continue home exercise program and anti-inflammatory measures. - Follow up with PM&R if needed.  6. Insomnia: - Advised to take small meals, keep a dark cool room, avoid caffeine, nicotine, TV. If she needs sound to sleep, she can use a sound machine or a box fan. - Consider OSA screening  7. Health maintenance: - She was agreeable to and received the influenza vaccine without adverse event. Blood work will be ordered for the next visit, to be done on an empty stomach a few days before the visit. - Advised oral analgesics 3 times daily as needed for pain, fever, chills, or headaches.  PHQ/GAD Scores       09/19/2024 1349 09/19/2024 1353       Patient Health Questionnaire-9 Score: -- 9     GAD-7 Total Score: 6 --          Medications Prescribed Orders Placed This Encounter  Medications  . SUMAtriptan (IMITREX) 100 mg tablet    Sig: Take 1 tablet (100 mg total) by mouth once as needed for migraine.    Dispense:  30 tablet    Refill:  1  . famotidine (PEPCID) 40 mg tablet    Sig: Take 1 tablet (40 mg total) by mouth daily.    Dispense:  90 tablet    Refill:  3  . clonazePAM (KlonoPIN) 0.5 mg tablet    Sig: Take 1 to 2 tablets nightly by mouth as needed for anxiety or sleep.    Dispense:  45 tablet    Refill:  0    Do not refill prior to 08/30/24    Other Orders Orders Placed This Encounter  Procedures  . Flu,Trivalent,IM, Preservative Free (FLULAVAL, (FLUZONE(SCOT)))      Patient expresses understanding of their current medications and use.  If a new prescription was given today, then I discussed potential side effects, drug interactions, instructions for taking the medication, and the consequences of not taking it.   Patient verbalized an understanding of these instructions. Patient is able to verbalize understanding of the care plan discussed  today.   Follow up: 4 months Annual physical, Recheck anxiety, GERD  Call sooner if needed.  Future Appointments  Date Time Provider Department Center  01/20/2025  2:40 PM [address] [place], PA-C [facility] [address]   I agree that the documentation is accurate and complete.       [1] Past Medical History: Diagnosis Date  . Ovarian cyst   [2]  Current Outpatient Medications:  .  buprenorphine-naloxone (SUBOXONE) 8-2 mg subl, , Disp: , Rfl:  .  clonazePAM (KlonoPIN) 0.5 mg tablet, TAKE 1 TABLET BY MOUTH AT BEDTIME, Disp: 30 tablet, Rfl:  0 .  cyclobenzaprine (FLEXERIL) 10 mg tablet, Take 1 tablet (10 mg total) by mouth 3 (three) times a day as needed for muscle spasms., Disp: 90 tablet, Rfl: 2 .  escitalopram (LEXAPRO) 10 mg tablet, Take 1 tablet (10 mg total) by mouth daily., Disp: 90 tablet, Rfl: 3 .  gabapentin (NEURONTIN) 400 mg capsule, Take 1 capsule by mouth in the morning and 1 capsule at noon and 1 capsule in the evening. Take with meals., Disp: , Rfl:  .  lamoTRIgine  (LaMICtal ) 25 mg tablet, , Disp: , Rfl:  .  meloxicam (MOBIC) 7.5 mg tablet, Take 1 tablet (7.5 mg total) by mouth daily., Disp: 90 tablet, Rfl: 3 .  QUEtiapine  (SEROquel ) 100 mg tablet, Take 100 mg by mouth nightly., Disp: , Rfl:  .  famotidine (PEPCID) 40 mg tablet, Take 1 tablet (40 mg total) by mouth daily., Disp: 90 tablet, Rfl: 3 .  SUMAtriptan (IMITREX) 100 mg tablet, Take 1 tablet (100 mg total) by mouth once as needed for migraine., Disp: 30 tablet, Rfl: 1 [3] No Known Allergies [4] History reviewed. No pertinent surgical history.

## 2024-09-22 ENCOUNTER — Other Ambulatory Visit: Payer: Self-pay

## 2024-09-22 ENCOUNTER — Encounter (HOSPITAL_COMMUNITY): Payer: Self-pay | Admitting: Psychiatry

## 2024-09-22 ENCOUNTER — Ambulatory Visit (HOSPITAL_COMMUNITY): Payer: MEDICAID | Admitting: Psychiatry

## 2024-09-22 VITALS — BP 127/84 | HR 94 | Ht 68.0 in | Wt 198.0 lb

## 2024-09-22 DIAGNOSIS — F1910 Other psychoactive substance abuse, uncomplicated: Secondary | ICD-10-CM | POA: Diagnosis not present

## 2024-09-22 DIAGNOSIS — F319 Bipolar disorder, unspecified: Secondary | ICD-10-CM

## 2024-09-22 DIAGNOSIS — F419 Anxiety disorder, unspecified: Secondary | ICD-10-CM | POA: Diagnosis not present

## 2024-09-22 MED ORDER — QUETIAPINE FUMARATE 100 MG PO TABS: 100.0000 mg | ORAL_TABLET | Freq: Every day | ORAL | 2 refills | Status: DC

## 2024-09-22 MED ORDER — LAMOTRIGINE 200 MG PO TABS: 200.0000 mg | ORAL_TABLET | Freq: Every day | ORAL | 2 refills | Status: DC
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
## 2024-10-10 ENCOUNTER — Ambulatory Visit (INDEPENDENT_AMBULATORY_CARE_PROVIDER_SITE_OTHER): Payer: MEDICAID | Admitting: Licensed Clinical Social Worker

## 2024-10-10 DIAGNOSIS — F319 Bipolar disorder, unspecified: Secondary | ICD-10-CM | POA: Diagnosis not present

## 2024-10-10 DIAGNOSIS — F419 Anxiety disorder, unspecified: Secondary | ICD-10-CM | POA: Diagnosis not present

## 2024-10-10 NOTE — Progress Notes (Signed)
 THERAPIST PROGRESS NOTE   Session Date: 10/10/2024  Session Time:  1513 - 1553 Virtual Visit via Video Note  I connected with Jessica Marshall on 10/10/24 at  3:00 PM EST by a video enabled telemedicine application and verified that I am speaking with the correct person using two identifiers.  Location: Patient: Home Provider: Home Office   I discussed the limitations of evaluation and management by telemedicine and the availability of in person appointments. The patient expressed understanding and agreed to proceed.   The patient was advised to call back or seek an in-person evaluation if the symptoms worsen or if the condition fails to improve as anticipated.  I provided 40 minutes of non-face-to-face time during this encounter.  Participation Level: Active  Behavioral Response: CasualAlertAnxious and Euthymic  Type of Therapy: Individual Therapy  Treatment Goals addressed:  Completed/Met (9) LTG: Reduce frequency, intensity, and duration of depression symptoms so that daily functioning is improved (OP Depression) LTG: Increase coping skills to manage depression and improve ability to perform daily activities (OP Depression) STG: Jessica Marshall will identify cognitive patterns and beliefs that support depression (OP Depression) STG: Jessica Marshall will reduce frequency of avoidant behaviors by 50% as evidenced by self-report in therapy sessions (Anxiety) LTG: Jessica Marshall will improve quality of life by maintaining ongoing abstinence from all mood-altering substances (Substance Use) LTG: Jessica Marshall will increase coping skills to promote long-term recovery and improve ability to perform daily activities (Substance Use) LTG: Jessica Marshall will score less than 5 on the Generalized Anxiety Disorder 7 Scale (GAD-7) (Anxiety) STG: Report a decrease in anxiety symptoms as evidenced by an overall reduction in anxiety score by a minimum of 25% on the Generalized Anxiety Disorder Scale (GAD-7) (Anxiety)  ProgressTowards  Goals: Met  Interventions: CBT, Motivational Interviewing, Strength-based, and Family Systems  Summary: Jessica Marshall is a 43 y.o. female with past psych history of Bipolar, Anxiety, and polysubstance use, presenting for follow-up therapy session in efforts to improve management of depressive, anxious and mood d/o related symptoms.   Patient actively engaged in check-in, presenting in primarily pleasant moods with mild anxiousness, and congruent affect throughout visit. Pt openly engaged in introductory check-in, sharing of Going good, further sharing of nothing having really changed, having received some money back from school, allowing to pay off remainder of probation dues, continuing to attend ADS classes, and engaging in schooling at [employer].  Pt shared of finding self continuing to gain clarity continued engagement with ADS and therapy, sharing of feeling good to be able to recall memories that were blocked until recently from many years ago.   Pt detailed recent increased stress surrounding mother's use and how this is proving to impact pt, further processing pt individual experiences with substance use and awareness of challenges experienced by mother.  Reassessed presenting anxious and depressive sxs via PHQ-9 and GAD-7, further exploring variances in screening scores, and reflecting on maintained reductions in screening ratings over the past 3-74mo. Further engaging in 79yr review of tx plan and progressions towards tx goals, identifying all tx goals included within plan to have been met, feeling comfortable discontinuing tx at this time, proving understanding of abilities to reconnect in coming months if/when finding necessary.     10/10/2024    3:37 PM 09/12/2024    3:17 PM 08/15/2024    3:24 PM 06/22/2024    3:22 PM 06/06/2024    2:25 PM  Depression screen PHQ 2/9  Decreased Interest 1 1 1 1 2   Down, Depressed, Hopeless 0 0 1  1 1  PHQ - 2 Score 1 1 2 2 3   Altered sleeping 0 0 0 0 0  Tired,  decreased energy 1 1 1 1 2   Change in appetite 0 0 0 0 0  Feeling bad or failure about yourself  0 0 0 1 1  Trouble concentrating 1 1 1 1 2   Moving slowly or fidgety/restless 0 0 0 0 0  Suicidal thoughts 0 0 0 0 0  PHQ-9 Score 3 3  4  5  8    Difficult doing work/chores Somewhat difficult Somewhat difficult Somewhat difficult Somewhat difficult Somewhat difficult     Data saved with a previous flowsheet row definition      10/10/2024    3:39 PM 09/12/2024    3:15 PM 08/15/2024    3:14 PM 06/22/2024    3:25 PM  GAD 7 : Generalized Anxiety Score  Nervous, Anxious, on Edge 1 1 1 1   Control/stop worrying 1 1 1 1   Worry too much - different things 0 1 1 1   Trouble relaxing 0 0 0 1  Restless 0 0 0 0  Easily annoyed or irritable 1 1 1 1   Afraid - awful might happen 1 1 1 1   Total GAD 7 Score 4 5 5 6   Anxiety Difficulty Somewhat difficult Somewhat difficult Somewhat difficult Somewhat difficult    Suicidal/Homicidal: No  Therapist Response:  Clinician actively greeted patient upon joining today's virtual visit, assessing presenting moods and affect. Engaged pt in introductory check-in, exploring recent events and presenting moods. Utilized open ended questions in eliciting recounts of events of the past month since previous visit, exploring individual efforts in managing challenges and continued progressions.  Utilized active listening techniques to provide support and validation of recounts of events and continued observed progressions.  Reassessed presenting depressive and anxious sxs via PHQ-9 and GAD-7, reviewing maintained reductions in sxs. Engaged in 46yr review of tx goals outlined in tx plan, processing successful completion of goals and desires to explore alternate tx goals, providing feedback of opportunity to resume tx over coming months if deemed necessary by pt. Clinician utilized CBT, MI, Strengths Based, and psycho-ed in supporting pt's navigation of progress and successful  completion of tx goals.  [x]  Cognitive Challenging []  Cognitive Refocusing []  Cognitive Reframing  [x]  Communication Skills []  Compliance Issues []  DBT [x]  Exploration of Coping Patterns []  Exploration of Emotions [x]  Exploration of Relationship Patterns []  Guided Imagery []  Interactive Feedback [x]  Interpersonal Resolutions []  Mindfulness Training []  Preventative Services [x]  Psycho-Education  []  Relaxation/Deep Breathing [x]  Review of Treatment Plan/Progress []  Role-Play/Behavioral Rehearsal  [x]  Structured Problem Solving [x]  Supportive Reflection [x]  Symptom Management  []  Other  Patient responded well to interventions. Patient continues to meet criteria for Bipolar, Anxiety, and polysubstance use. Patient will continue to benefit from engagement in outpatient therapy due to being the least restrictive service to meet presenting needs. Pt proves to maintain consistent progression towards identified goals.  Homework: None.   Plan: Discharge.  Diagnosis:  Encounter Diagnoses  Name Primary?   Bipolar I disorder (HCC) Yes   Anxiety     Collaboration of Care: Other none deemed necessary at this time.  Patient/Guardian was advised Release of Information must be obtained prior to any record release in order to collaborate their care with an outside provider. Patient/Guardian was advised if they have not already done so to contact the registration department to sign all necessary forms in order for us  to release information regarding their care.  Consent: Patient/Guardian gives verbal consent for treatment and assignment of benefits for services provided during this visit. Patient/Guardian expressed understanding and agreed to proceed.   [facility] JONETTA Maris, MSW, LCSW 10/10/2024,  3:40 PM

## 2024-11-07 ENCOUNTER — Ambulatory Visit (HOSPITAL_COMMUNITY): Payer: MEDICAID | Admitting: Licensed Clinical Social Worker

## 2024-12-22 ENCOUNTER — Ambulatory Visit (HOSPITAL_COMMUNITY): Payer: MEDICAID | Admitting: Psychiatry

## 2024-12-22 ENCOUNTER — Encounter (HOSPITAL_COMMUNITY): Payer: Self-pay | Admitting: Psychiatry

## 2024-12-22 VITALS — BP 124/85 | HR 103 | Ht 68.0 in | Wt 199.0 lb

## 2024-12-22 DIAGNOSIS — F1910 Other psychoactive substance abuse, uncomplicated: Secondary | ICD-10-CM | POA: Diagnosis not present

## 2024-12-22 DIAGNOSIS — F419 Anxiety disorder, unspecified: Secondary | ICD-10-CM | POA: Diagnosis not present

## 2024-12-22 DIAGNOSIS — F319 Bipolar disorder, unspecified: Secondary | ICD-10-CM | POA: Diagnosis not present

## 2024-12-22 MED ORDER — QUETIAPINE FUMARATE 100 MG PO TABS: 100.0000 mg | ORAL_TABLET | Freq: Every day | ORAL | 2 refills | Status: AC

## 2024-12-22 MED ORDER — ESCITALOPRAM OXALATE 10 MG PO TABS: 10.0000 mg | ORAL_TABLET | Freq: Every day | ORAL | 2 refills | Status: AC

## 2024-12-22 MED ORDER — LAMOTRIGINE 200 MG PO TABS: 200.0000 mg | ORAL_TABLET | Freq: Every day | ORAL | 2 refills | Status: AC
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
## 2025-03-23 ENCOUNTER — Ambulatory Visit (HOSPITAL_COMMUNITY): Payer: MEDICAID | Admitting: Psychiatry
